# Patient Record
Sex: Female | Born: 1970 | Race: Black or African American | Hispanic: No | Smoking: Former smoker
Health system: Southern US, Community
[De-identification: ages and names within clinical notes are randomized; demographics above are authoritative.]

## PROBLEM LIST (undated history)

## (undated) DIAGNOSIS — M549 Dorsalgia, unspecified: Secondary | ICD-10-CM

## (undated) DIAGNOSIS — G43909 Migraine, unspecified, not intractable, without status migrainosus: Secondary | ICD-10-CM

## (undated) DIAGNOSIS — F329 Major depressive disorder, single episode, unspecified: Secondary | ICD-10-CM

## (undated) DIAGNOSIS — F419 Anxiety disorder, unspecified: Secondary | ICD-10-CM

## (undated) DIAGNOSIS — R42 Dizziness and giddiness: Secondary | ICD-10-CM

## (undated) DIAGNOSIS — F32A Depression, unspecified: Secondary | ICD-10-CM

## (undated) HISTORY — DX: Depression, unspecified: F32.A

## (undated) HISTORY — PX: TUBAL LIGATION: SHX77

## (undated) HISTORY — PX: ABDOMINAL HYSTERECTOMY: SHX81

## (undated) HISTORY — PX: ABLATION: SHX5711

## (undated) HISTORY — DX: Anxiety disorder, unspecified: F41.9

## (undated) HISTORY — DX: Major depressive disorder, single episode, unspecified: F32.9

---

## 2013-11-23 ENCOUNTER — Encounter (HOSPITAL_BASED_OUTPATIENT_CLINIC_OR_DEPARTMENT_OTHER): Payer: Self-pay | Admitting: Emergency Medicine

## 2013-11-23 ENCOUNTER — Emergency Department (HOSPITAL_BASED_OUTPATIENT_CLINIC_OR_DEPARTMENT_OTHER)
Admission: EM | Admit: 2013-11-23 | Discharge: 2013-11-23 | Disposition: A | Discharge status: Home / Self Care | Payer: No Typology Code available for payment source | Attending: Emergency Medicine | Admitting: Emergency Medicine

## 2013-11-23 DIAGNOSIS — M5412 Radiculopathy, cervical region: Secondary | ICD-10-CM | POA: Insufficient documentation

## 2013-11-23 DIAGNOSIS — Z720 Tobacco use: Secondary | ICD-10-CM | POA: Insufficient documentation

## 2013-11-23 MED ORDER — HYDROCODONE-ACETAMINOPHEN 5-325 MG PO TABS: 1.0000 | ORAL_TABLET | Freq: Four times a day (QID) | ORAL | Status: DC | PRN

## 2013-11-23 MED ORDER — HYDROCODONE-ACETAMINOPHEN 5-325 MG PO TABS
1.0000 | ORAL_TABLET | Freq: Once | ORAL | Status: AC
  Administered 2013-11-23: 1 via ORAL
  Filled 2013-11-23: qty 1

## 2013-11-23 NOTE — Discharge Instructions (Signed)

## 2013-11-23 NOTE — ED Provider Notes (Addendum)
CSN: [id number]     Arrival date & time 11/23/13  0034 History   First MD Initiated Contact with Patient 11/23/13 0054     Chief Complaint  Patient presents with  . 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 She is here with a four-day history of pain in her right neck radiating to her right shoulder and down her right upper arm. Are also associated paresthesias in the fingers of her right hand. Pain is worse with movement of the neck and less so with palpation of the neck and shoulder. Pain is also worse with movement of the right shoulder. She states her pain as a 7/10 at rest and worse with movement. She reports no functional defect. She denies specific injury. She has not taken anything for the pain stating that ibuprofen "doesn't do anything".  History reviewed. No pertinent past medical history. Past Surgical History  Procedure Laterality Date  . Tubal ligation    . Cesarean section    . Ablation     History reviewed. No pertinent family history. History  Substance Use Topics  . Smoking status: Current Every Day Smoker -- 0.50 packs/day for 27 years    Types: Cigarettes  . Smokeless tobacco: Never Used  . Alcohol Use: Not on file     Comment: occasions    OB History   Grav Para Term Preterm Abortions TAB SAB Ect Mult Living                 Review of Systems  All other systems reviewed and are negative.   Allergies  Augmentin; Keflex; Naproxen; and Zithromax  Home Medications   Prior to Admission medications   Not on File   BP 127/80  Pulse 84  Temp(Src) 98.5 F (36.9 C) (Oral)  Resp 16  Ht 5\' 6"  (1.676 m)  Wt 147 lb (66.679 kg)  BMI 23.74 kg/m2  SpO2 100%  LMP 11/13/2013  Physical Exam General: Well-developed, well-nourished female in no acute distress; appearance consistent with age of record HENT: normocephalic; atraumatic Eyes:  pupils equal, round and reactive to light; extraocular muscles intact Neck: supple; pain in right side of neck and shoulder on rotation to the left or right, flexion forward and less so on flexion backwards Heart: regular rate and rhythm Lungs: Normal respiratory effort and excursion Abdomen: soft; nondistended Extremities: No deformity; full range of motion but with pain on movement of the right shoulder Neurologic: Awake, alert and oriented; motor function intact in all extremities and symmetric; no facial droop Skin: Warm and dry Psychiatric: Normal mood and affect    ED Course  Procedures (including critical care time)  MDM     Wynetta Fines, MD 11/23/13 0103  Wynetta Fines, MD 11/23/13 0347

## 2013-11-23 NOTE — ED Notes (Signed)
Pt from home. Complaining of right shoulder pain x 1 week with increase in pain last two days. 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 Pt has not taken any OTC for the pain and has applied heat to the site with no decrease in pain. Pt rates pain 7/10 at this time.

## 2013-12-19 ENCOUNTER — Ambulatory Visit: Payer: No Typology Code available for payment source | Admitting: Family Medicine

## 2013-12-20 ENCOUNTER — Ambulatory Visit: Payer: No Typology Code available for payment source | Admitting: Family Medicine

## 2013-12-30 ENCOUNTER — Encounter: Payer: Self-pay | Admitting: Family Medicine

## 2013-12-30 ENCOUNTER — Ambulatory Visit (INDEPENDENT_AMBULATORY_CARE_PROVIDER_SITE_OTHER): Payer: No Typology Code available for payment source | Admitting: Family Medicine

## 2013-12-30 ENCOUNTER — Encounter (INDEPENDENT_AMBULATORY_CARE_PROVIDER_SITE_OTHER): Payer: Self-pay

## 2013-12-30 VITALS — BP 116/77 | HR 89 | Ht 66.0 in | Wt 143.0 lb

## 2013-12-30 DIAGNOSIS — M545 Low back pain, unspecified: Secondary | ICD-10-CM

## 2013-12-30 DIAGNOSIS — M542 Cervicalgia: Secondary | ICD-10-CM

## 2013-12-30 MED ORDER — PREDNISONE (PAK) 10 MG PO TABS: ORAL_TABLET | ORAL | Status: DC

## 2013-12-30 MED ORDER — OXYCODONE-ACETAMINOPHEN 5-325 MG PO TABS
1.0000 | ORAL_TABLET | Freq: Four times a day (QID) | ORAL | Status: DC | PRN
Start: 2013-12-30 — End: 2014-02-13

## 2013-12-30 MED ORDER — DICLOFENAC SODIUM 75 MG PO TBEC: 75.0000 mg | DELAYED_RELEASE_TABLET | Freq: Two times a day (BID) | ORAL | Status: DC

## 2013-12-30 MED ORDER — CYCLOBENZAPRINE HCL 10 MG PO TABS: 10.0000 mg | ORAL_TABLET | Freq: Three times a day (TID) | ORAL | Status: DC | PRN

## 2013-12-30 NOTE — Patient Instructions (Signed)
You have cervical radiculopathy (a pinched nerve in the neck). Prednisone 6 day dose pack to relieve irritation/inflammation of the nerve. Voltaren twice a day with food for pain and inflammation - start day AFTER finishing prednisone. Flexeril three times a day as needed for muscle spasms (can make you sleepy - if so do not drive while taking this). Percocet for severe pain (no driving on this medicine). Simple range of motion exercises within limits of pain to prevent further stiffness. Consider physical therapy for stretching, exercises, traction, and modalities. Heat 15 minutes at a time 3-4 times a day to help with spasms. Watch head position when on computers, texting, when sleeping in bed - should in line with back to prevent further nerve traction and irritation. If not improving we will consider an MRI.  You have low back pain likely from lumbar strain. Treatment is similar. Refer to handout regarding exercises and stretches to do for this. Do home exercises for shoulder as well 3 sets of 10 each direction. Follow up with me in 6 weeks for reevaluation.

## 2014-01-04 DIAGNOSIS — M542 Cervicalgia: Secondary | ICD-10-CM | POA: Insufficient documentation

## 2014-01-04 DIAGNOSIS — M545 Low back pain, unspecified: Secondary | ICD-10-CM | POA: Insufficient documentation

## 2014-01-04 DIAGNOSIS — M5412 Radiculopathy, cervical region: Secondary | ICD-10-CM | POA: Insufficient documentation

## 2014-01-04 NOTE — Assessment & Plan Note (Signed)
2/2 cervical radiculopathy.  Start with prednisone dose pack, then voltaren twice a day.  Flexeril with percocet as needed.  Previously did physical therapy - declined at this time.  Do home exercises.  Heat, ergonomic issues discussed.  If not improving will consider MRI.

## 2014-01-04 NOTE — Assessment & Plan Note (Signed)
consistent with lumbar strain.  Medications as noted above.  HEP reviewed to do daily.  F/u in 6 weeks for both issues.

## 2014-01-04 NOTE — Progress Notes (Signed)
PCP: No PCP Per Patient  Subjective:   HPI: Patient is a 43 y.o. female here for right shoulder, back pain.  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 Now has associated burning into her right arm and hand. Tried norco, icing, heat, aleve, flexeril. In 2013 did PT for this but also for her low back. Had an MRI in 2013 and was told she had a pinched nerve in her low back but then told she did not. Has had a cortisone injection in right shoulder without much benefit. No radiation into legs. No bowel/bladder dysfunction.  No past medical history on file.  No current outpatient prescriptions on file prior to visit.   No current facility-administered medications on file prior to visit.    Past Surgical History  Procedure Laterality Date  . Tubal ligation    . Cesarean section    . Ablation      Allergies  Allergen Reactions  . Augmentin [Amoxicillin-Pot Clavulanate] Hives  . Keflex [Cephalexin] Hives    Also respiratory depression  . Naproxen Hives  . Zithromax [Azithromycin] Hives    History   Social History  . Marital Status: Legally Separated    Spouse Name: N/A    Number of Children: N/A  . Years of Education: N/A   Occupational History  . Not on file.   Social History Main Topics  . Smoking status: Current Every Day Smoker -- 0.50 packs/day for 27 years    Types: Cigarettes  . Smokeless tobacco: Never Used  . Alcohol Use: Not on file     Comment: occasions   . Drug Use: No  . Sexual Activity: Yes    Birth Control/ Protection: None   Other Topics Concern  . Not on file   Social History Narrative    No family history on file.  BP 116/77 mmHg  Pulse 89  Ht 5\' 6"  (1.676 m)  Wt 143 lb (64.864 kg)  BMI 23.09 kg/m2  Review of Systems: See HPI above.    Objective:  Physical Exam:  Gen: NAD  Neck: No gross deformity, swelling, bruising. TTP right  cervical paraspinal region.  No midline/bony TTP. FROM neck - pain lateral rotations, extension. BUE strength 5/5.   Sensation intact to light touch.   1+ equal reflexes in triceps, biceps, brachioradialis tendons. Negative spurlings. NV intact distal BUEs.  R shoulder: No swelling, ecchymoses.  No gross deformity. No TTP. FROM. Negative Hawkins, Neers. Negative Speeds, [employer]. Strength 5/5 with empty can and resisted internal/external rotation.  Pain empty can mildly. Negative apprehension. NV intact distally.  Back: No gross deformity, scoliosis. TTP bilateral paraspinal regions.  No midline or bony TTP. FROM with pain on flexion. Strength LEs 5/5 all muscle groups.  2+ MSRs in patellar and achilles tendons, equal bilaterally. Negative SLRs. Sensation intact to light touch bilaterally. Negative logroll bilateral hips Negative fabers and piriformis stretches.    Assessment & Plan:  1. Neck pain - 2/2 cervical radiculopathy.  Start with prednisone dose pack, then voltaren twice a day.  Flexeril with percocet as needed.  Previously did physical therapy - declined at this time.  Do home exercises.  Heat, ergonomic issues discussed.  If not improving will consider MRI.  2. Low back pain - consistent with lumbar strain.  Medications as noted above.  HEP reviewed to do daily.  F/u in 6 weeks for  both issues.

## 2014-02-08 ENCOUNTER — Encounter (HOSPITAL_BASED_OUTPATIENT_CLINIC_OR_DEPARTMENT_OTHER): Payer: Self-pay | Admitting: Emergency Medicine

## 2014-02-08 ENCOUNTER — Emergency Department (HOSPITAL_BASED_OUTPATIENT_CLINIC_OR_DEPARTMENT_OTHER)
Admission: EM | Admit: 2014-02-08 | Discharge: 2014-02-08 | Disposition: A | Discharge status: Home / Self Care | Payer: No Typology Code available for payment source | Attending: Emergency Medicine | Admitting: Emergency Medicine

## 2014-02-08 DIAGNOSIS — Z720 Tobacco use: Secondary | ICD-10-CM | POA: Insufficient documentation

## 2014-02-08 DIAGNOSIS — M549 Dorsalgia, unspecified: Secondary | ICD-10-CM | POA: Diagnosis present

## 2014-02-08 DIAGNOSIS — S29019A Strain of muscle and tendon of unspecified wall of thorax, initial encounter: Secondary | ICD-10-CM

## 2014-02-08 DIAGNOSIS — Z7952 Long term (current) use of systemic steroids: Secondary | ICD-10-CM | POA: Diagnosis not present

## 2014-02-08 DIAGNOSIS — W240XXA Contact with lifting devices, not elsewhere classified, initial encounter: Secondary | ICD-10-CM | POA: Insufficient documentation

## 2014-02-08 DIAGNOSIS — S29012A Strain of muscle and tendon of back wall of thorax, initial encounter: Secondary | ICD-10-CM | POA: Insufficient documentation

## 2014-02-08 DIAGNOSIS — Z880 Allergy status to penicillin: Secondary | ICD-10-CM | POA: Insufficient documentation

## 2014-02-08 DIAGNOSIS — Z79899 Other long term (current) drug therapy: Secondary | ICD-10-CM | POA: Insufficient documentation

## 2014-02-08 DIAGNOSIS — S39012A Strain of muscle, fascia and tendon of lower back, initial encounter: Secondary | ICD-10-CM | POA: Diagnosis not present

## 2014-02-08 DIAGNOSIS — Y93F2 Activity, caregiving, lifting: Secondary | ICD-10-CM | POA: Diagnosis not present

## 2014-02-08 DIAGNOSIS — Y998 Other external cause status: Secondary | ICD-10-CM | POA: Diagnosis not present

## 2014-02-08 DIAGNOSIS — Y9289 Other specified places as the place of occurrence of the external cause: Secondary | ICD-10-CM | POA: Diagnosis not present

## 2014-02-08 MED ORDER — HYDROCODONE-ACETAMINOPHEN 5-325 MG PO TABS: 1.0000 | ORAL_TABLET | Freq: Four times a day (QID) | ORAL | Status: DC | PRN

## 2014-02-08 MED ORDER — HYDROMORPHONE HCL 1 MG/ML IJ SOLN
1.0000 mg | Freq: Once | INTRAMUSCULAR | Status: AC
  Administered 2014-02-08: 1 mg via INTRAMUSCULAR
  Filled 2014-02-08: qty 1

## 2014-02-08 NOTE — ED Provider Notes (Signed)
CSN: [id number]     Arrival date & time 02/08/14  0003 History   First MD Initiated Contact with Patient 02/08/14 0017     Chief Complaint  Patient presents with  . Back Pain     (Consider location/radiation/quality/duration/timing/severity/associated sxs/prior Treatment) Patient is a 43 y.o. female presenting with back pain. The history is provided by the patient.  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 Relieved by:  Nothing Worsened by:  Bending, coughing, movement and twisting Ineffective treatments:  Heating pad, muscle relaxants, narcotics and NSAIDs Associated symptoms: leg pain   Associated symptoms: no abdominal pain, no bladder incontinence, no dysuria, no fever, no headaches, no numbness, no paresthesias, no perianal numbness, no tingling and no weakness   Risk factors comment:  History of back and neck pain   History reviewed. No pertinent past medical history. Past Surgical History  Procedure Laterality Date  . Tubal ligation    . Cesarean section    . Ablation     No family history on file. History  Substance Use Topics  . Smoking status: Current Every Day Smoker -- 0.50 packs/day for 27 years    Types: Cigarettes  . Smokeless tobacco: Never Used  . Alcohol Use: 0.6 oz/week    1 Glasses of wine per week     Comment: occasions    OB History    No data available     Review of Systems  Constitutional: Negative for fever.  Gastrointestinal: Negative for abdominal pain.  Genitourinary: Negative for bladder incontinence and dysuria.  Musculoskeletal: Positive for back pain.    Neurological: Negative for tingling, weakness, numbness, headaches and paresthesias.  All other systems reviewed and are negative.     Allergies  Augmentin; Keflex; Naproxen; and Zithromax  Home Medications   Prior to Admission medications   Medication Sig Start Date End Date Taking? Authorizing Provider  meclizine (ANTIVERT) 25 MG tablet Take 25 mg by mouth 3 (three) times daily as needed for dizziness.   Yes Historical Provider, MD  Temazepam (RESTORIL PO) Take by mouth.   Yes Historical Provider, MD  cyclobenzaprine (FLEXERIL) 10 MG tablet Take 1 tablet (10 mg total) by mouth every 8 (eight) hours as needed. 12/30/13   Dene Gentry, MD  diclofenac (VOLTAREN) 75 MG EC tablet Take 1 tablet (75 mg total) by mouth 2 (two) times daily. Start day AFTER finishing prednisone. 12/30/13   Dene Gentry, MD  HYDROcodone-acetaminophen (NORCO/VICODIN) 5-325 MG per tablet Take 1 tablet by mouth every 6 (six) hours as needed for moderate pain or severe pain. 02/08/14   Blanchie Dessert, MD  oxyCODONE-acetaminophen (PERCOCET/ROXICET) 5-325 MG per tablet Take 1 tablet by mouth every 6 (six) hours as needed for severe pain. 12/30/13   Dene Gentry, MD  predniSONE (STERAPRED UNI-PAK) 10 MG tablet 6 tabs po day 1, 5 tabs po day 2, 4 tabs po day 3, 3 tabs po day 4, 2 tabs po day 5, 1 tab po day 6 12/30/13   Dene Gentry, MD   BP 120/69 mmHg  Pulse 89  Temp(Src) 99.3 F (37.4 C) (Oral)  Resp 18  Ht 5\' 7"  (1.702 m)  Wt 152 lb (68.947 kg)  BMI 23.80 kg/m2  SpO2 100%  LMP 01/11/2014 (Approximate) Physical Exam  Constitutional: She is oriented to person, place, and time. She appears well-developed and well-nourished. No distress.  HENT:  Head: Normocephalic and atraumatic.  Eyes: EOM are normal. Pupils are equal, round, and reactive to light.  Cardiovascular: Normal rate, regular rhythm, normal heart sounds and intact distal pulses.  Exam reveals no friction rub.   No murmur  heard. Pulmonary/Chest: Effort normal and breath sounds normal. She has no wheezes. She has no rales.  Abdominal: Soft. Bowel sounds are normal. She exhibits no distension. There is no tenderness. There is no rebound and no guarding.  Musculoskeletal: Normal range of motion. She exhibits tenderness.       Thoracic back: She exhibits tenderness, pain and spasm. She exhibits normal range of motion and no bony tenderness.       Lumbar back: She exhibits tenderness, pain and spasm. She exhibits no bony tenderness.       Back:  No edema  Neurological: She is alert and oriented to person, place, and time. She has normal strength. No cranial nerve deficit or sensory deficit. Gait normal.  Skin: Skin is warm and dry. No rash noted.  Psychiatric: She has a normal mood and affect. Her behavior is normal.  Nursing note and vitals reviewed.   ED Course  Procedures (including critical care time) Labs Review Labs Reviewed - No data to display  Imaging Review No results found.   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  She has been taking percocet/flexeril and diclofenac however states she does not like the way oxy makes her feel and diclofenac makes her itch.  She is NV intact here and able to ambulate without difficulty.  No IVDU or bowel/bladder sx concerning for cord compromise.  Pt sees Dr. Barbaraann Barthel for this issue.  Pt given vicodin to use instead of oxy and given several days off work.  She has f/u appt with Dr. Barbaraann Barthel in [place].    Blanchie Dessert, MD 02/08/14 [phone]

## 2014-02-08 NOTE — ED Notes (Signed)
Pain in low back, right shoulder and arm.  Pt sts this is a recurrence of a chronic problem.

## 2014-02-13 ENCOUNTER — Encounter: Payer: Self-pay | Admitting: Family Medicine

## 2014-02-13 ENCOUNTER — Ambulatory Visit (HOSPITAL_BASED_OUTPATIENT_CLINIC_OR_DEPARTMENT_OTHER)
Admission: RE | Admit: 2014-02-13 | Discharge: 2014-02-13 | Disposition: A | Discharge status: Home / Self Care | Payer: No Typology Code available for payment source | Source: Ambulatory Visit | Attending: Family Medicine | Admitting: Family Medicine

## 2014-02-13 ENCOUNTER — Ambulatory Visit (INDEPENDENT_AMBULATORY_CARE_PROVIDER_SITE_OTHER): Payer: No Typology Code available for payment source | Admitting: Family Medicine

## 2014-02-13 VITALS — BP 124/80 | HR 80 | Ht 67.0 in | Wt 153.0 lb

## 2014-02-13 DIAGNOSIS — M5032 Other cervical disc degeneration, mid-cervical region: Secondary | ICD-10-CM | POA: Diagnosis not present

## 2014-02-13 DIAGNOSIS — M542 Cervicalgia: Secondary | ICD-10-CM | POA: Diagnosis not present

## 2014-02-13 DIAGNOSIS — R200 Anesthesia of skin: Secondary | ICD-10-CM | POA: Diagnosis not present

## 2014-02-13 DIAGNOSIS — M545 Low back pain: Secondary | ICD-10-CM | POA: Insufficient documentation

## 2014-02-13 DIAGNOSIS — M5442 Lumbago with sciatica, left side: Secondary | ICD-10-CM | POA: Diagnosis not present

## 2014-02-13 DIAGNOSIS — M5441 Lumbago with sciatica, right side: Secondary | ICD-10-CM

## 2014-02-13 MED ORDER — HYDROCODONE-ACETAMINOPHEN 5-325 MG PO TABS
1.0000 | ORAL_TABLET | Freq: Four times a day (QID) | ORAL | Status: DC | PRN
Start: 2014-02-13 — End: 2014-07-12

## 2014-02-13 NOTE — Patient Instructions (Signed)
Get x-rays as you leave today - we will contact you with the results. Take flexeril, hydrocodone as needed for severe pain. The notes have to be faxed in to get approval for the MRIs of your neck and back.

## 2014-02-14 NOTE — Assessment & Plan Note (Signed)
has also not improved with home exercises, prednisone, voltaren, flexeril, percocet.  Will go ahead with MRI to assess for disc pathology/herniation.

## 2014-02-14 NOTE — Assessment & Plan Note (Signed)
2/2 cervical radiculopathy.  Has not improved since 12/30/13 with home exercises, prednisone, voltaren, flexeril, percocet.  Will go ahead with MRI to assess for disc herniation.

## 2014-02-14 NOTE — Progress Notes (Signed)
PCP: PALMER, CHARLES  Subjective:   HPI: Patient is a 44 y.o. female here for right shoulder, back pain.  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 Now has associated burning into her right arm and hand. Tried norco, icing, heat, aleve, flexeril. In 2013 did PT for this but also for her low back. Had an MRI in 2013 and was told she had a pinched nerve in her low back but then told she did not. Has had a cortisone injection in right shoulder without much benefit. No radiation into legs. No bowel/bladder dysfunction.  1/4: Patient reports she's not had much change in neck or back pain since last visit. Both about 7/10 level of pain. Finished prednisone. Gets burning sensation into both arms and legs. Doing home exercises and stretches since last visit. Went to ED on 12/31 due to pain. Tried prednisone, voltaren, flexeril, percocet, norco since last visit. No bowel/bladder dysfunction.  No past medical history on file.  Current Outpatient Prescriptions on File Prior to Visit  Medication Sig Dispense Refill  . cyclobenzaprine (FLEXERIL) 10 MG tablet Take 1 tablet (10 mg total) by mouth every 8 (eight) hours as needed. 60 tablet 1  . diclofenac (VOLTAREN) 75 MG EC tablet Take 1 tablet (75 mg total) by mouth 2 (two) times daily. Start day AFTER finishing prednisone. 60 tablet 1  . meclizine (ANTIVERT) 25 MG tablet Take 25 mg by mouth 3 (three) times daily as needed for dizziness.    . Temazepam (RESTORIL PO) Take by mouth.     No current facility-administered medications on file prior to visit.    Past Surgical History  Procedure Laterality Date  . Tubal ligation    . Cesarean section    . Ablation      Allergies  Allergen Reactions  . Augmentin [Amoxicillin-Pot Clavulanate] Hives  . Keflex [Cephalexin] Hives    Also respiratory depression  . Naproxen Hives  . Zithromax  [Azithromycin] Hives    History   Social History  . Marital Status: Legally Separated    Spouse Name: N/A    Number of Children: N/A  . Years of Education: N/A   Occupational History  . Not on file.   Social History Main Topics  . Smoking status: Current Every Day Smoker -- 0.50 packs/day for 27 years    Types: Cigarettes  . Smokeless tobacco: Never Used  . Alcohol Use: 0.6 oz/week    1 Glasses of wine per week     Comment: occasions   . Drug Use: No  . Sexual Activity: Yes    Birth Control/ Protection: Surgical   Other Topics Concern  . Not on file   Social History Narrative    No family history on file.  BP 124/80 mmHg  Pulse 80  Ht 5\' 7"  (1.702 m)  Wt 153 lb (69.4 kg)  BMI 23.96 kg/m2  LMP 02/13/2014  Review of Systems: See HPI above.    Objective:  Physical Exam:  Gen: NAD  Neck: No gross deformity, swelling, bruising. TTP right cervical paraspinal region.  No midline/bony TTP. FROM neck - pain lateral rotations, extension. BUE strength 5/5.   Sensation intact to light touch.   1+ equal reflexes in triceps, biceps, brachioradialis tendons. Negative spurlings. NV intact distal BUEs.  R shoulder: No swelling, ecchymoses.  No gross deformity. No TTP. FROM. Negative Hawkins, Neers. Negative  Speeds, [employer]. Strength 5/5 with empty can and resisted internal/external rotation.  Negative apprehension. NV intact distally.  Back: No gross deformity, scoliosis. TTP bilateral paraspinal regions.  No midline or bony TTP. FROM with pain on flexion > extension. Strength LEs 5/5 all muscle groups.  2+ MSRs in patellar and achilles tendons, equal bilaterally. Negative SLRs. Sensation intact to light touch bilaterally. Negative logroll bilateral hips Negative fabers and piriformis stretches.    Assessment & Plan:  1. Neck pain - 2/2 cervical radiculopathy.  Has not improved since 12/30/13 with home exercises, prednisone, voltaren, flexeril,  percocet.  Will go ahead with MRI to assess for disc herniation.  2. Low back pain - has also not improved with home exercises, prednisone, voltaren, flexeril, percocet.  Will go ahead with MRI to assess for disc pathology/herniation.

## 2014-02-15 NOTE — Addendum Note (Signed)
Addended by: Sherrie George F on: 02/15/2014 09:00 AM   Modules accepted: Orders

## 2014-04-01 ENCOUNTER — Ambulatory Visit (HOSPITAL_BASED_OUTPATIENT_CLINIC_OR_DEPARTMENT_OTHER): Payer: No Typology Code available for payment source

## 2014-05-25 ENCOUNTER — Encounter (HOSPITAL_BASED_OUTPATIENT_CLINIC_OR_DEPARTMENT_OTHER): Payer: Self-pay | Admitting: Emergency Medicine

## 2014-05-25 ENCOUNTER — Emergency Department (HOSPITAL_BASED_OUTPATIENT_CLINIC_OR_DEPARTMENT_OTHER)
Admission: EM | Admit: 2014-05-25 | Discharge: 2014-05-26 | Disposition: A | Discharge status: Home / Self Care | Payer: No Typology Code available for payment source | Attending: Emergency Medicine | Admitting: Emergency Medicine

## 2014-05-25 DIAGNOSIS — M7088 Other soft tissue disorders related to use, overuse and pressure other site: Secondary | ICD-10-CM | POA: Insufficient documentation

## 2014-05-25 DIAGNOSIS — Z791 Long term (current) use of non-steroidal anti-inflammatories (NSAID): Secondary | ICD-10-CM | POA: Diagnosis not present

## 2014-05-25 DIAGNOSIS — Z720 Tobacco use: Secondary | ICD-10-CM | POA: Diagnosis not present

## 2014-05-25 DIAGNOSIS — S6991XA Unspecified injury of right wrist, hand and finger(s), initial encounter: Secondary | ICD-10-CM | POA: Diagnosis present

## 2014-05-25 DIAGNOSIS — Y9389 Activity, other specified: Secondary | ICD-10-CM | POA: Diagnosis not present

## 2014-05-25 DIAGNOSIS — Y998 Other external cause status: Secondary | ICD-10-CM | POA: Insufficient documentation

## 2014-05-25 DIAGNOSIS — X58XXXA Exposure to other specified factors, initial encounter: Secondary | ICD-10-CM | POA: Insufficient documentation

## 2014-05-25 DIAGNOSIS — Y9289 Other specified places as the place of occurrence of the external cause: Secondary | ICD-10-CM | POA: Diagnosis not present

## 2014-05-25 DIAGNOSIS — X503XXA Overexertion from repetitive movements, initial encounter: Secondary | ICD-10-CM

## 2014-05-25 DIAGNOSIS — T148XXA Other injury of unspecified body region, initial encounter: Secondary | ICD-10-CM

## 2014-05-25 HISTORY — DX: Dorsalgia, unspecified: M54.9

## 2014-05-25 HISTORY — DX: Dizziness and giddiness: R42

## 2014-05-25 NOTE — ED Notes (Signed)
Pt woke up this morning with right wrist pain.  No known injury.  No hx of same.

## 2014-05-25 NOTE — ED Notes (Addendum)
Woke this am w rt wrist pain  Denies known inj,  Slight swelling noted

## 2014-05-26 ENCOUNTER — Encounter (HOSPITAL_BASED_OUTPATIENT_CLINIC_OR_DEPARTMENT_OTHER): Payer: Self-pay | Admitting: Emergency Medicine

## 2014-05-26 ENCOUNTER — Emergency Department (HOSPITAL_BASED_OUTPATIENT_CLINIC_OR_DEPARTMENT_OTHER): Payer: No Typology Code available for payment source

## 2014-05-26 MED ORDER — PREDNISONE 50 MG PO TABS
60.0000 mg | ORAL_TABLET | Freq: Once | ORAL | Status: DC
  Filled 2014-05-26 (×2): qty 1

## 2014-05-26 MED ORDER — HYDROCODONE-ACETAMINOPHEN 5-325 MG PO TABS: 1.0000 | ORAL_TABLET | Freq: Four times a day (QID) | ORAL | Status: DC | PRN

## 2014-05-26 MED ORDER — TRAMADOL HCL 50 MG PO TABS
50.0000 mg | ORAL_TABLET | Freq: Once | ORAL | Status: DC
  Filled 2014-05-26: qty 1

## 2014-05-26 MED ORDER — IBUPROFEN 800 MG PO TABS: 800.0000 mg | ORAL_TABLET | Freq: Once | ORAL | Status: DC

## 2014-05-26 NOTE — ED Provider Notes (Signed)
CSN: [id number]     Arrival date & time 05/25/14  2339 History   First MD Initiated Contact with Patient 05/26/14 0015     Chief Complaint  Patient presents with  . Wrist Pain     (Consider location/radiation/quality/duration/timing/severity/associated sxs/prior Treatment) Patient is a 44 y.o. female presenting with wrist pain. The history is provided by the patient.  Wrist Pain This is a new problem. The current episode started yesterday. The problem occurs constantly. The problem has not changed since onset.Pertinent negatives include no abdominal pain. Nothing aggravates the symptoms. Nothing relieves the symptoms. She has tried a cold compress for the symptoms. The treatment provided no relief.  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    Past Medical History  Diagnosis Date  . Back pain   . Vertigo    Past Surgical History  Procedure Laterality Date  . Tubal ligation    . Cesarean section    . Ablation     History reviewed. No pertinent family history. History  Substance Use Topics  . Smoking status: Current Every Day Smoker -- 0.50 packs/day for 27 years    Types: Cigarettes  . Smokeless tobacco: Never Used  . Alcohol Use: 0.6 oz/week    1 Glasses of wine per week     Comment: occasions    OB History    No data available     Review of Systems  Gastrointestinal: Negative for abdominal pain.  All other systems reviewed and are negative.     Allergies  Augmentin; Keflex; Naproxen; and Zithromax  Home Medications   Prior to Admission medications   Medication Sig Start Date End Date Taking? Authorizing Provider  cyclobenzaprine (FLEXERIL) 10 MG tablet Take 1 tablet (10 mg total) by mouth every 8 (eight) hours as needed. 12/30/13   Dene Gentry, MD  diclofenac (VOLTAREN) 75 MG EC tablet Take 1 tablet (75 mg total) by mouth 2 (two) times daily. Start day AFTER finishing prednisone. 12/30/13   Dene Gentry, MD   HYDROcodone-acetaminophen (NORCO/VICODIN) 5-325 MG per tablet Take 1 tablet by mouth every 6 (six) hours as needed for moderate pain or severe pain. 02/13/14   Dene Gentry, MD  HYDROcodone-acetaminophen (NORCO/VICODIN) 5-325 MG per tablet Take 1 tablet by mouth every 6 (six) hours as needed for severe pain. 05/26/14   Evagelia Knack, MD  meclizine (ANTIVERT) 25 MG tablet Take 25 mg by mouth 3 (three) times daily as needed for dizziness.    Historical Provider, MD  Temazepam (RESTORIL PO) Take by mouth.    Historical Provider, MD   BP 123/69 mmHg  Pulse 93  Temp(Src) 98.5 F (36.9 C) (Oral)  Resp 16  Ht 5\' 7"  (1.702 m)  Wt 149 lb 9.3 oz (67.849 kg)  BMI 23.42 kg/m2  SpO2 100%  LMP 05/17/2014 (Approximate) Physical Exam  Constitutional: She is oriented to person, place, and time. She appears well-developed and well-nourished. No distress.  HENT:  Head: Normocephalic and atraumatic.  Mouth/Throat: Oropharynx is clear and moist.  Eyes: Conjunctivae and EOM are normal.  Neck: Normal range of motion. Neck supple.  Cardiovascular: Normal rate and regular rhythm.   Pulmonary/Chest: Effort normal and breath sounds normal. No respiratory distress. She has no wheezes. She has no rales.  Abdominal: Soft. Bowel sounds are normal. There is no tenderness.  Musculoskeletal: Normal range of motion.       Right wrist: She exhibits normal range of motion, no  tenderness, no bony tenderness, no swelling, no effusion, no crepitus, no deformity and no laceration.       Right forearm: She exhibits no tenderness, no bony tenderness, no swelling, no edema, no deformity and no laceration.  Right hand NVI  Neurological: She is alert and oriented to person, place, and time.  Skin: Skin is warm and dry.  Psychiatric: She has a normal mood and affect.    ED Course  Procedures (including critical care time) Labs Review Labs Reviewed - No data to display  Imaging Review Dg Wrist Complete Right  05/26/2014    CLINICAL DATA:  Right wrist pain, onset last evening, pain throughout the day.  EXAM: RIGHT WRIST - COMPLETE 3+ VIEW  COMPARISON:  None.  FINDINGS: No fracture or dislocation. The alignment and joint spaces are maintained. Bone mineralization is normal. No erosion or periosteal reaction. Scaphoid is normal. No soft tissue abnormality.  IMPRESSION: No acute bony abnormality.   Electronically Signed   By: Jeb Levering M.D.   On: 05/26/2014 01:10     EKG Interpretation None      MDM   Final diagnoses:  Repetitive use syndrome  Refused pain medication in the ED.    Repetitive use injury.  Will place in wrist splint for comfort, ice elevation and close follow up    Destyn Schuyler, MD 05/26/14 [phone]

## 2014-07-12 ENCOUNTER — Ambulatory Visit (INDEPENDENT_AMBULATORY_CARE_PROVIDER_SITE_OTHER): Payer: No Typology Code available for payment source | Admitting: Family Medicine

## 2014-07-12 ENCOUNTER — Encounter: Payer: Self-pay | Admitting: Family Medicine

## 2014-07-12 VITALS — BP 120/75 | HR 83 | Ht 66.0 in | Wt 143.0 lb

## 2014-07-12 DIAGNOSIS — M542 Cervicalgia: Secondary | ICD-10-CM | POA: Diagnosis not present

## 2014-07-12 MED ORDER — DICLOFENAC SODIUM 75 MG PO TBEC: 75.0000 mg | DELAYED_RELEASE_TABLET | Freq: Two times a day (BID) | ORAL | Status: DC

## 2014-07-12 MED ORDER — CYCLOBENZAPRINE HCL 10 MG PO TABS
10.0000 mg | ORAL_TABLET | Freq: Three times a day (TID) | ORAL | Status: DC | PRN
Start: 2014-07-12 — End: 2015-02-05

## 2014-07-12 NOTE — Patient Instructions (Signed)
We will go ahead with approval for the MRI of your neck. Physical therapy is the other option for this though you've not improved with extensive conservative treatment over the past several months.

## 2014-07-14 NOTE — Assessment & Plan Note (Signed)
2/2 cervical radiculopathy.  Struggling with this for several months despite home exercises, prednisone, voltaren, flexeril, percocet.  Will go ahead with MRI to assess for disc herniation.

## 2014-07-14 NOTE — Progress Notes (Addendum)
PCP: PALMER, CHARLES  Subjective:   HPI: Patient is a 44 y.o. female here for right shoulder, neck pain.  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 Now has associated burning into her right arm and hand. Tried norco, icing, heat, aleve, flexeril. In 2013 did PT for this but also for her low back. Had an MRI in 2013 and was told she had a pinched nerve in her low back but then told she did not. Has had a cortisone injection in right shoulder without much benefit. No radiation into legs. No bowel/bladder dysfunction.  1/4: Patient reports she's not had much change in neck or back pain since last visit. Both about 7/10 level of pain. Finished prednisone. Gets burning sensation into both arms and legs. Doing home exercises and stretches since last visit. Went to ED on 12/31 due to pain. Tried prednisone, voltaren, flexeril, percocet, norco since last visit. No bowel/bladder dysfunction.  6/1: Patient never went ahead with MRI of her cervical spine due to cost though interested in pursuing this now. Still with pain into right side of neck into shoulder. Associated burning. Previously tried prednisone, voltaren, flexeril, pain medication. Radiation goes to fingertips with numbness on right side. No bowel/bladder dysfunction.  Past Medical History  Diagnosis Date  . Back pain   . Vertigo     Current Outpatient Prescriptions on File Prior to Visit  Medication Sig Dispense Refill  . meclizine (ANTIVERT) 25 MG tablet Take 25 mg by mouth 3 (three) times daily as needed for dizziness.     No current facility-administered medications on file prior to visit.    Past Surgical History  Procedure Laterality Date  . Tubal ligation    . Cesarean section    . Ablation      Allergies  Allergen Reactions  . Augmentin [Amoxicillin-Pot Clavulanate] Hives  . Keflex [Cephalexin]  Hives    Also respiratory depression  . Naproxen Hives  . Zithromax [Azithromycin] Hives    History   Social History  . Marital Status: Legally Separated    Spouse Name: N/A  . Number of Children: N/A  . Years of Education: N/A   Occupational History  . Not on file.   Social History Main Topics  . Smoking status: Current Every Day Smoker -- 0.50 packs/day for 27 years    Types: Cigarettes  . Smokeless tobacco: Never Used  . Alcohol Use: 0.6 oz/week    1 Glasses of wine per week     Comment: occasions   . Drug Use: No  . Sexual Activity: Yes    Birth Control/ Protection: Surgical   Other Topics Concern  . Not on file   Social History Narrative    No family history on file.  BP 120/75 mmHg  Pulse 83  Ht 5\' 6"  (1.676 m)  Wt 143 lb (64.864 kg)  BMI 23.09 kg/m2  Review of Systems: See HPI above.    Objective:  Physical Exam:  Gen: NAD  Neck: No gross deformity, swelling, bruising. TTP right cervical paraspinal region.  No midline/bony TTP. FROM neck - pain lateral rotations, extension. BUE strength 5/5.   Sensation intact to light touch.   1+ equal reflexes in biceps, brachioradialis tendons.  Trace right triceps, 1+ left triceps. Negative spurlings. NV intact distal BUEs.    Assessment & Plan:  1. Neck pain - 2/2 cervical radiculopathy.  Struggling with this for several months despite home exercises, prednisone, voltaren, flexeril, percocet.  Will go ahead with MRI to assess for disc herniation.  Addendum:  MRI reviewed and discussed with patient.  She has prominent spondylosis at C5-6 on the right, possible affect on the C6 nerve root.  Discussed PT, ESI - she would like to try ESI first given her level of pain and call us in 1-2 weeks.

## 2014-07-21 ENCOUNTER — Emergency Department (HOSPITAL_BASED_OUTPATIENT_CLINIC_OR_DEPARTMENT_OTHER)
Admission: EM | Admit: 2014-07-21 | Discharge: 2014-07-22 | Disposition: A | Discharge status: Home / Self Care | Payer: No Typology Code available for payment source | Attending: Emergency Medicine | Admitting: Emergency Medicine

## 2014-07-21 ENCOUNTER — Encounter (HOSPITAL_BASED_OUTPATIENT_CLINIC_OR_DEPARTMENT_OTHER): Payer: Self-pay

## 2014-07-21 DIAGNOSIS — R202 Paresthesia of skin: Secondary | ICD-10-CM | POA: Insufficient documentation

## 2014-07-21 DIAGNOSIS — M545 Low back pain, unspecified: Secondary | ICD-10-CM

## 2014-07-21 DIAGNOSIS — M549 Dorsalgia, unspecified: Secondary | ICD-10-CM | POA: Diagnosis present

## 2014-07-21 DIAGNOSIS — Z791 Long term (current) use of non-steroidal anti-inflammatories (NSAID): Secondary | ICD-10-CM | POA: Insufficient documentation

## 2014-07-21 DIAGNOSIS — Z720 Tobacco use: Secondary | ICD-10-CM | POA: Diagnosis not present

## 2014-07-21 DIAGNOSIS — G8929 Other chronic pain: Secondary | ICD-10-CM | POA: Insufficient documentation

## 2014-07-21 MED ORDER — OXYCODONE-ACETAMINOPHEN 5-325 MG PO TABS: 1.0000 | ORAL_TABLET | ORAL | Status: DC | PRN

## 2014-07-21 MED ORDER — HYDROMORPHONE HCL 1 MG/ML IJ SOLN
2.0000 mg | Freq: Once | INTRAMUSCULAR | Status: AC
Start: 2014-07-22 — End: 2014-07-22
  Administered 2014-07-22: 2 mg via INTRAMUSCULAR
  Filled 2014-07-21: qty 2

## 2014-07-21 NOTE — ED Provider Notes (Addendum)
CSN: [id number]     Arrival date & time 07/21/14  2054 History  This chart was scribed for Shanon Rosser, MD by Delphia Grates, ED Scribe. This patient was seen in room MH11/MH11 and the patient's care was started at 11:45 PM.    Chief Complaint  Patient presents with  . Back Pain     The history is provided by the patient. No language interpreter was used.     HPI Comments: Shelby Moreno is a 44 y.o. female, with history of chronic back pain, who presents to the Emergency Department complaining of burning, 7/10, bilateral lower back pain with radiation to the left neck that began approximately 5 hours ago. 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 There is associated tingling in the right lower leg and patient notes that the pain is worse with movement. There is no longer any radiation to the left neck. Patient reports her back pain is being managed by Dr. Barbaraann Barthel and she is scheduled to have an MRI tomorrow. Patient has not contacted Dr. Barbaraann Barthel regarding this exacerbation of her chronic back pain. She has taken Aleve without relief. She is requesting complete elimination of her pain and wants to know exactly what is causing it.   Past Medical History  Diagnosis Date  . Back pain   . Vertigo    Past Surgical History  Procedure Laterality Date  . Tubal ligation    . Cesarean section    . Ablation     No family history on file. History  Substance Use Topics  . Smoking status: Current Every Day Smoker -- 0.50 packs/day for 27 years    Types: Cigarettes  . Smokeless tobacco: Never Used  . Alcohol Use: 0.6 oz/week    1 Glasses of wine per week     Comment: occasions    OB History    No data available     Review of Systems  A complete 10 system review of systems was obtained and all systems are negative except as noted in the HPI and PMH.    Allergies  Augmentin; Keflex; Naproxen; and Zithromax  Home Medications    Prior to Admission medications   Medication Sig Start Date End Date Taking? Authorizing Provider  cyclobenzaprine (FLEXERIL) 10 MG tablet Take 1 tablet (10 mg total) by mouth every 8 (eight) hours as needed. 07/12/14   Dene Gentry, MD  diclofenac (VOLTAREN) 75 MG EC tablet Take 1 tablet (75 mg total) by mouth 2 (two) times daily. Start day AFTER finishing prednisone. 07/12/14   Dene Gentry, MD  meclizine (ANTIVERT) 25 MG tablet Take 25 mg by mouth 3 (three) times daily as needed for dizziness.    Historical Provider, MD  oxyCODONE-acetaminophen (PERCOCET) 5-325 MG per tablet Take 1 tablet by mouth every 4 (four) hours as needed (for pain). 07/21/14   Eboney Claybrook, MD  temazepam (RESTORIL) 22.5 MG capsule  06/04/14   Historical Provider, MD   Triage Vitals: BP 107/68 mmHg  Pulse 93  Temp(Src) 98.6 F (37 C) (Oral)  Resp 18  Ht 5\' 6"  (1.676 m)  Wt 143 lb (64.864 kg)  BMI 23.09 kg/m2  SpO2 100%  LMP 07/07/2014  Physical Exam  Nursing note and vitals reviewed. General: Well-developed, well-nourished female in no acute distress; appearance consistent with age of record HENT: normocephalic; atraumatic Eyes: pupils equal, round and reactive to light; extraocular muscles intact Neck: supple Heart: regular rate  and rhythm Lungs: clear to auscultation bilaterally Abdomen: soft; nondistended; nontender; no masses or hepatosplenomegaly; bowel sounds present BACK: bilateral paralumbar tenderness; positive straight leg raise on the left; negative straight leg raise on the right. Extremities: No deformity; full range of motion; pulses normal Neurologic: Awake, alert and oriented; motor function intact in all extremities and symmetric; no facial droop; sensation intact on the lower extremities, but subjectively decreased on the left lower leg Skin: Warm and dry Psychiatric: Normal mood and affect    ED Course  Procedures (including critical care time)  DIAGNOSTIC STUDIES: Oxygen  Saturation is 100% on room air, normal by my interpretation.    COORDINATION OF CARE: At 2350 Discussed treatment plan with patient which includes pain medication. Patient agrees.     MDM  The patient was advised that complete elimination of her pain is not feasible but that we will give her a shot of Dilaudid and provide Percocet to get her through until Monday when she can contact Dr. Barbaraann Barthel. She was advised that determining the cause of her pain will best be accomplished by the MRI she has scheduled tomorrow.  Final diagnoses:  Acute exacerbation of chronic low back pain   I personally performed the services described in this documentation, which was scribed in my presence. The recorded information has been reviewed and is accurate.   Shanon Rosser, MD 07/22/14 0003  Shanon Rosser, MD 07/22/14 [id number]

## 2014-07-21 NOTE — ED Notes (Signed)
Pt states she was at work, bent over had severe lower back pain; states hx of same, has a scheduled MRI for tomorrow

## 2014-07-22 ENCOUNTER — Ambulatory Visit (HOSPITAL_BASED_OUTPATIENT_CLINIC_OR_DEPARTMENT_OTHER)
Admission: RE | Admit: 2014-07-22 | Discharge: 2014-07-22 | Disposition: A | Discharge status: Home / Self Care | Payer: No Typology Code available for payment source | Source: Ambulatory Visit | Attending: Family Medicine | Admitting: Family Medicine

## 2014-07-22 ENCOUNTER — Emergency Department (HOSPITAL_BASED_OUTPATIENT_CLINIC_OR_DEPARTMENT_OTHER)
Admission: EM | Admit: 2014-07-22 | Discharge: 2014-07-22 | Disposition: A | Discharge status: Home / Self Care | Payer: No Typology Code available for payment source

## 2014-07-22 DIAGNOSIS — M47892 Other spondylosis, cervical region: Secondary | ICD-10-CM | POA: Diagnosis not present

## 2014-07-22 DIAGNOSIS — M4802 Spinal stenosis, cervical region: Secondary | ICD-10-CM | POA: Diagnosis not present

## 2014-07-22 DIAGNOSIS — G9589 Other specified diseases of spinal cord: Secondary | ICD-10-CM | POA: Insufficient documentation

## 2014-07-22 DIAGNOSIS — M542 Cervicalgia: Secondary | ICD-10-CM | POA: Diagnosis present

## 2014-07-25 NOTE — Addendum Note (Signed)
Addended by: Dene Gentry on: 07/25/2014 04:04 PM   Modules accepted: Miquel Dunn

## 2014-07-28 ENCOUNTER — Other Ambulatory Visit: Payer: Self-pay | Admitting: Family Medicine

## 2014-07-28 DIAGNOSIS — M5412 Radiculopathy, cervical region: Secondary | ICD-10-CM

## 2014-08-03 ENCOUNTER — Ambulatory Visit
Admission: RE | Admit: 2014-08-03 | Discharge: 2014-08-03 | Disposition: A | Discharge status: Home / Self Care | Payer: No Typology Code available for payment source | Source: Ambulatory Visit | Attending: Family Medicine | Admitting: Family Medicine

## 2014-08-03 DIAGNOSIS — M5412 Radiculopathy, cervical region: Secondary | ICD-10-CM

## 2014-08-03 MED ORDER — TRIAMCINOLONE ACETONIDE 40 MG/ML IJ SUSP (RADIOLOGY)
60.0000 mg | Freq: Once | INTRAMUSCULAR | Status: AC
  Administered 2014-08-03: 60 mg via EPIDURAL

## 2014-08-03 MED ORDER — IOHEXOL 300 MG/ML  SOLN
1.0000 mL | Freq: Once | INTRAMUSCULAR | Status: AC | PRN
  Administered 2014-08-03: 1 mL via EPIDURAL

## 2014-08-03 NOTE — Discharge Instructions (Signed)

## 2014-09-15 ENCOUNTER — Encounter (HOSPITAL_BASED_OUTPATIENT_CLINIC_OR_DEPARTMENT_OTHER): Payer: Self-pay | Admitting: *Deleted

## 2014-09-15 ENCOUNTER — Ambulatory Visit: Payer: No Typology Code available for payment source | Admitting: Family Medicine

## 2014-09-15 ENCOUNTER — Emergency Department (HOSPITAL_BASED_OUTPATIENT_CLINIC_OR_DEPARTMENT_OTHER)
Admission: EM | Admit: 2014-09-15 | Discharge: 2014-09-15 | Disposition: A | Discharge status: Home / Self Care | Payer: No Typology Code available for payment source | Attending: Emergency Medicine | Admitting: Emergency Medicine

## 2014-09-15 DIAGNOSIS — Z720 Tobacco use: Secondary | ICD-10-CM | POA: Diagnosis not present

## 2014-09-15 DIAGNOSIS — M549 Dorsalgia, unspecified: Secondary | ICD-10-CM

## 2014-09-15 DIAGNOSIS — G8929 Other chronic pain: Secondary | ICD-10-CM | POA: Diagnosis not present

## 2014-09-15 DIAGNOSIS — M545 Low back pain: Secondary | ICD-10-CM | POA: Diagnosis not present

## 2014-09-15 DIAGNOSIS — M542 Cervicalgia: Secondary | ICD-10-CM | POA: Insufficient documentation

## 2014-09-15 MED ORDER — HYDROMORPHONE HCL 1 MG/ML IJ SOLN
1.0000 mg | Freq: Once | INTRAMUSCULAR | Status: AC
  Administered 2014-09-15: 1 mg via INTRAMUSCULAR
  Filled 2014-09-15: qty 1

## 2014-09-15 MED ORDER — DEXAMETHASONE SODIUM PHOSPHATE 10 MG/ML IJ SOLN
10.0000 mg | Freq: Once | INTRAMUSCULAR | Status: AC
  Administered 2014-09-15: 10 mg via INTRAMUSCULAR
  Filled 2014-09-15: qty 1

## 2014-09-15 NOTE — ED Provider Notes (Signed)
CSN: [id number]     Arrival date & time 09/15/14  0955 History   First MD Initiated Contact with Patient 09/15/14 1011     No chief complaint on file.    (Consider location/radiation/quality/duration/timing/severity/associated sxs/prior Treatment) HPI Comments: Patient presents with neck and back pain. She has a history of chronic pain in her right neck and right lower back. She has radiation down her right arm with some intermittent numbness in her right hand. She also has some radiation to her right thigh. She denies any numbness in her leg. She denies any urinary or bowel incontinence. She denies any urinary symptoms. She denies any fevers or chills. She denies any abdominal pain. She sees Dr. Barbaraann Barthel for her neck and back pain. She had an MRI done in June and in early July she had an epidural injection. She states that the injection helped her pain but now it's back again. She feels like the injection is warm. It's the same symptoms that she had before. She denies any recent injuries. She has an appointment to see Dr. Barbaraann Barthel this morning. She thought that her appointment was at 10:00 that when she went there this morning, it was at 9:00. Due to the fact that she had missed her appointment, they were unable to see her and she came down here for assessment.   Past Medical History  Diagnosis Date  . Back pain   . Vertigo    Past Surgical History  Procedure Laterality Date  . Tubal ligation    . Cesarean section    . Ablation     No family history on file. History  Substance Use Topics  . Smoking status: Current Every Day Smoker -- 0.50 packs/day for 27 years    Types: Cigarettes  . Smokeless tobacco: Never Used  . Alcohol Use: 0.6 oz/week    1 Glasses of wine per week     Comment: occasions    OB History    No data available     Review of Systems  Constitutional: Negative for fever.  Gastrointestinal: Negative for nausea and vomiting.  Musculoskeletal: Positive for back pain  and neck pain.  Skin: Negative for wound.  Neurological: Positive for numbness. Negative for weakness and headaches.      Allergies  Augmentin; Keflex; Naproxen; and Zithromax  Home Medications   Prior to Admission medications   Medication Sig Start Date End Date Taking? Authorizing Provider  amitriptyline (ELAVIL) 10 MG tablet Take 10 mg by mouth at bedtime.   Yes Historical Provider, MD  cyclobenzaprine (FLEXERIL) 10 MG tablet Take 1 tablet (10 mg total) by mouth every 8 (eight) hours as needed. 07/12/14  Yes Dene Gentry, MD  meclizine (ANTIVERT) 25 MG tablet Take 25 mg by mouth 3 (three) times daily as needed for dizziness.   Yes Historical Provider, MD  temazepam (RESTORIL) 22.5 MG capsule  06/04/14  Yes Historical Provider, MD   BP 111/71 mmHg  Pulse 82  Temp(Src) 98.2 F (36.8 C) (Oral)  Resp 16  Ht 5\' 6"  (1.676 m)  Wt 148 lb (67.132 kg)  BMI 23.90 kg/m2  SpO2 100%  LMP 09/14/2014 Physical Exam  Constitutional: She is oriented to person, place, and time. She appears well-developed and well-nourished.  HENT:  Head: Normocephalic and atraumatic.  Neck: Normal range of motion. Neck supple.  Cardiovascular: Normal rate.   Pulmonary/Chest: Effort normal.  Musculoskeletal: She exhibits no edema or tenderness.  Patient has tenderness along the left cervical and upper  thoracic paraspinal muscles as well as along the left trapezius area. There is some tenderness along the left lumbar area as well. She has normal sensation and motor function in all extremities. Peripheral pulses are intact. Negative straight leg raise bilaterally. Patellar reflexes symmetric bilaterally.  Neurological: She is alert and oriented to person, place, and time.  Skin: Skin is warm and dry.  Psychiatric: She has a normal mood and affect.    ED Course  Procedures (including critical care time) Labs Review Labs Reviewed - No data to display  Imaging Review No results found.   EKG  Interpretation None      MDM   Final diagnoses:  Chronic back pain    Patient with an exacerbation of her chronic neck and back pain. She has no neurologic deficits or signs of cauda equina. She was given a shot of Dilaudid and Decadron in the ED. I did advise her that we don't prescribe pain management for chronic conditions. She will follow-up with Dr. Barbaraann Barthel on Monday.    Malvin Johns, MD 09/15/14 1024

## 2014-09-15 NOTE — Discharge Instructions (Signed)
Back Pain, Adult Low back pain is very common. About 1 in 5 people have back pain.The cause of low back pain is rarely dangerous. The pain often gets better over time.About half of people with a sudden onset of back pain feel better in just 2 weeks. About 8 in 10 people feel better by 6 weeks.  CAUSES Some common causes of back pain include:  Strain of the muscles or ligaments supporting the spine.  Wear and tear (degeneration) of the spinal discs.  Arthritis.  Direct injury to the back. DIAGNOSIS Most of the time, the direct cause of low back pain is not known.However, back pain can be treated effectively even when the exact cause of the pain is unknown.Answering your caregiver's questions about your overall health and symptoms is one of the most accurate ways to make sure the cause of your pain is not dangerous. If your caregiver needs more information, he or she may order lab work or imaging tests (X-rays or MRIs).However, even if imaging tests show changes in your back, this usually does not require surgery. HOME CARE INSTRUCTIONS For many people, back pain returns.Since low back pain is rarely dangerous, it is often a condition that people can learn to manageon their own.   Remain active. It is stressful on the back to sit or stand in one place. Do not sit, drive, or stand in one place for more than 30 minutes at a time. Take short walks on level surfaces as soon as pain allows.Try to increase the length of time you walk each day.  Do not stay in bed.Resting more than 1 or 2 days can delay your recovery.  Do not avoid exercise or work.Your body is made to move.It is not dangerous to be active, even though your back may hurt.Your back will likely heal faster if you return to being active before your pain is gone.  Pay attention to your body when you bend and lift. Many people have less discomfortwhen lifting if they bend their knees, keep the load close to their bodies,and  avoid twisting. Often, the most comfortable positions are those that put less stress on your recovering back.  Find a comfortable position to sleep. Use a firm mattress and lie on your side with your knees slightly bent. If you lie on your back, put a pillow under your knees.  Only take over-the-counter or prescription medicines as directed by your caregiver. Over-the-counter medicines to reduce pain and inflammation are often the most helpful.Your caregiver may prescribe muscle relaxant drugs.These medicines help dull your pain so you can more quickly return to your normal activities and healthy exercise.  Put ice on the injured area.  Put ice in a plastic bag.  Place a towel between your skin and the bag.  Leave the ice on for 15-20 minutes, 03-04 times a day for the first 2 to 3 days. After that, ice and heat may be alternated to reduce pain and spasms.  Ask your caregiver about trying back exercises and gentle massage. This may be of some benefit.  Avoid feeling anxious or stressed.Stress increases muscle tension and can worsen back pain.It is important to recognize when you are anxious or stressed and learn ways to manage it.Exercise is a great option. SEEK MEDICAL CARE IF:  You have pain that is not relieved with rest or medicine.  You have pain that does not improve in 1 week.  You have new symptoms.  You are generally not feeling well. SEEK   IMMEDIATE MEDICAL CARE IF:   You have pain that radiates from your back into your legs.  You develop new bowel or bladder control problems.  You have unusual weakness or numbness in your arms or legs.  You develop nausea or vomiting.  You develop abdominal pain.  You feel faint. Document Released: 01/27/2005 Document Revised: 07/29/2011 Document Reviewed: 05/31/2013 [employer] Patient Information 2015 [employer]. This information is not intended to replace advice given to you by your health care provider. Make sure you  discuss any questions you have with your health care provider.  

## 2014-09-15 NOTE — ED Notes (Signed)
C/o low back pain and neck pain and right shoulder, numbness in right arm. States she has been treated for same x 1 year and has had shots in neck. Also c/o pain in inner right thigh.

## 2014-09-18 ENCOUNTER — Encounter: Payer: Self-pay | Admitting: Family Medicine

## 2014-09-18 ENCOUNTER — Ambulatory Visit (INDEPENDENT_AMBULATORY_CARE_PROVIDER_SITE_OTHER): Payer: No Typology Code available for payment source | Admitting: Family Medicine

## 2014-09-18 VITALS — BP 114/73 | HR 86 | Ht 67.0 in | Wt 148.0 lb

## 2014-09-18 DIAGNOSIS — M545 Low back pain, unspecified: Secondary | ICD-10-CM

## 2014-09-18 DIAGNOSIS — M542 Cervicalgia: Secondary | ICD-10-CM | POA: Diagnosis not present

## 2014-09-18 MED ORDER — PREDNISONE 10 MG PO TABS: ORAL_TABLET | ORAL | Status: DC

## 2014-09-18 NOTE — Assessment & Plan Note (Signed)
2/2 cervical radiculopathy.  Improved with ESI but pain has returned.  Will repeat this at level of C5-6 on right.

## 2014-09-18 NOTE — Assessment & Plan Note (Signed)
with radiation into right greater than left legs.  Will prescribed prednisone dose pack, go ahead with MRI (had ordered previously since she had tried nsaids, home exercises, prednisone).

## 2014-09-18 NOTE — Patient Instructions (Signed)
You have cervical and lumbar radiculopathy (pinched nerves in your neck and low back). Take tylenol for baseline pain relief (1-2 extra strength tabs 3x/day) A prednisone dose pack is the best option for immediate relief and may be prescribed. Day after finishing prednisone start aleve or ibuprofen with food for pain and inflammation. Consider robaxin or flexeril as needed for muscle spasms (no driving on this medicine if it makes you sleepy). Stay as active as possible. Physical therapy has been shown to be helpful as well. Strengthening of low back muscles, abdominal musculature are key for long term pain relief. We will set up another injection for your neck and get approval for the MRI of your low back.

## 2014-09-18 NOTE — Progress Notes (Addendum)
PCP: PALMER, CHARLES  Subjective:   HPI: Patient is a 44 y.o. female here for right shoulder, neck pain.  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 Now has associated burning into her right arm and hand. Tried norco, icing, heat, aleve, flexeril. In 2013 did PT for this but also for her low back. Had an MRI in 2013 and was told she had a pinched nerve in her low back but then told she did not. Has had a cortisone injection in right shoulder without much benefit. No radiation into legs. No bowel/bladder dysfunction.  1/4: Patient reports she's not had much change in neck or back pain since last visit. Both about 7/10 level of pain. Finished prednisone. Gets burning sensation into both arms and legs. Doing home exercises and stretches since last visit. Went to ED on 12/31 due to pain. Tried prednisone, voltaren, flexeril, percocet, norco since last visit. No bowel/bladder dysfunction.  6/1: Patient never went ahead with MRI of her cervical spine due to cost though interested in pursuing this now. Still with pain into right side of neck into shoulder. Associated burning. Previously tried prednisone, voltaren, flexeril, pain medication. Radiation goes to fingertips with numbness on right side. No bowel/bladder dysfunction.  8/8: Patient returns stating injection helped until about 1 week ago for her neck. Started to get pain in right side of neck into arm and fingers. Numbness into fingertips again. No bowel/bladder dysfunction. She also reports low back pain has been worsening - been to ED twice since last visit here for this. Goes into both legs but more into the right. Decreased sensation medially in right leg.  Past Medical History  Diagnosis Date  . Back pain   . Vertigo     Current Outpatient Prescriptions on File Prior to Visit  Medication Sig Dispense Refill   . amitriptyline (ELAVIL) 10 MG tablet Take 10 mg by mouth at bedtime.    . cyclobenzaprine (FLEXERIL) 10 MG tablet Take 1 tablet (10 mg total) by mouth every 8 (eight) hours as needed. 60 tablet 1  . meclizine (ANTIVERT) 25 MG tablet Take 25 mg by mouth 3 (three) times daily as needed for dizziness.    . temazepam (RESTORIL) 22.5 MG capsule   5   No current facility-administered medications on file prior to visit.    Past Surgical History  Procedure Laterality Date  . Tubal ligation    . Cesarean section    . Ablation      Allergies  Allergen Reactions  . Augmentin [Amoxicillin-Pot Clavulanate] Hives  . Keflex [Cephalexin] Hives and Other (See Comments)    Also respiratory depression  . Naproxen Hives  . Zithromax [Azithromycin] Hives    History   Social History  . Marital Status: Legally Separated    Spouse Name: N/A  . Number of Children: N/A  . Years of Education: N/A   Occupational History  . Not on file.   Social History Main Topics  . Smoking status: Current Every Day Smoker -- 0.50 packs/day for 27 years    Types: Cigarettes  . Smokeless tobacco: Never Used  . Alcohol Use: 0.6 oz/week    1 Glasses of wine per week     Comment: occasions   . Drug Use: No  . Sexual Activity: Yes    Birth Control/ Protection: Surgical   Other Topics Concern  . Not  on file   Social History Narrative    No family history on file.  BP 114/73 mmHg  Pulse 86  Ht 5\' 7"  (1.702 m)  Wt 148 lb (67.132 kg)  BMI 23.17 kg/m2  LMP 09/14/2014  Review of Systems: See HPI above.    Objective:  Physical Exam:  Gen: NAD  Neck: No gross deformity, swelling, bruising. TTP right > left cervical paraspinal region.  No midline/bony TTP. FROM neck - pain flexion and extension. BUE strength 5/5.   Sensation intact to light touch currently.   1+ equal reflexes in biceps, brachioradialis tendons.  Trace right triceps, 1+ left triceps. Negative spurlings. NV intact distal  BUEs.  Back: No gross deformity, scoliosis. TTP bilateral paraspinal regions.  No midline or bony TTP. FROM. Strength LEs 5/5 all muscle groups.   2+ MSRs in patellar and achilles tendons, equal bilaterally. Negative SLRs. Sensation diminished to light touch medial right lower leg. Negative logroll bilateral hips Negative fabers and piriformis stretches.    Assessment & Plan:  1. Neck pain - 2/2 cervical radiculopathy.  Improved with ESI but pain has returned.  Will repeat this at level of C5-6 on right.    2. Low back pain - with radiation into right greater than left legs.  Will prescribed prednisone dose pack, go ahead with MRI (had ordered previously since she had tried nsaids, home exercises, prednisone).    Addendum:  MRI reviewed and discussed with patient.  Looks similar to MRI from 2 years ago.  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  Discussed options - she would like to try an ESI at this level - will arrange.  Call us in 1-2 weeks to let us know how she's doing.

## 2014-09-23 ENCOUNTER — Ambulatory Visit (HOSPITAL_BASED_OUTPATIENT_CLINIC_OR_DEPARTMENT_OTHER): Payer: No Typology Code available for payment source

## 2014-09-30 ENCOUNTER — Ambulatory Visit (HOSPITAL_BASED_OUTPATIENT_CLINIC_OR_DEPARTMENT_OTHER)
Admission: RE | Admit: 2014-09-30 | Discharge: 2014-09-30 | Disposition: A | Discharge status: Home / Self Care | Payer: No Typology Code available for payment source | Source: Ambulatory Visit | Attending: Family Medicine | Admitting: Family Medicine

## 2014-09-30 DIAGNOSIS — M5137 Other intervertebral disc degeneration, lumbosacral region: Secondary | ICD-10-CM | POA: Insufficient documentation

## 2014-09-30 DIAGNOSIS — M5127 Other intervertebral disc displacement, lumbosacral region: Secondary | ICD-10-CM | POA: Insufficient documentation

## 2014-09-30 DIAGNOSIS — M5441 Lumbago with sciatica, right side: Secondary | ICD-10-CM

## 2014-09-30 DIAGNOSIS — M5442 Lumbago with sciatica, left side: Secondary | ICD-10-CM

## 2014-09-30 DIAGNOSIS — M5440 Lumbago with sciatica, unspecified side: Secondary | ICD-10-CM | POA: Diagnosis present

## 2014-10-06 ENCOUNTER — Telehealth: Payer: Self-pay | Admitting: Family Medicine

## 2014-10-06 ENCOUNTER — Other Ambulatory Visit: Payer: Self-pay | Admitting: Family Medicine

## 2014-10-06 DIAGNOSIS — M5416 Radiculopathy, lumbar region: Secondary | ICD-10-CM

## 2014-10-06 DIAGNOSIS — M5412 Radiculopathy, cervical region: Secondary | ICD-10-CM

## 2014-10-09 NOTE — Addendum Note (Signed)
Addended by: Dene Gentry on: 10/09/2014 09:38 AM   Modules accepted: Miquel Dunn

## 2014-10-09 NOTE — Telephone Encounter (Signed)
Spoke to patient and she is aware of the appointment dates and times.

## 2014-10-10 ENCOUNTER — Ambulatory Visit
Admission: RE | Admit: 2014-10-10 | Discharge: 2014-10-10 | Disposition: A | Discharge status: Home / Self Care | Payer: No Typology Code available for payment source | Source: Ambulatory Visit | Attending: Family Medicine | Admitting: Family Medicine

## 2014-10-10 DIAGNOSIS — M5416 Radiculopathy, lumbar region: Secondary | ICD-10-CM

## 2014-10-10 MED ORDER — METHYLPREDNISOLONE ACETATE 40 MG/ML INJ SUSP (RADIOLOG
120.0000 mg | Freq: Once | INTRAMUSCULAR | Status: AC
  Administered 2014-10-10: 120 mg via EPIDURAL

## 2014-10-10 MED ORDER — IOHEXOL 180 MG/ML  SOLN
1.0000 mL | Freq: Once | INTRAMUSCULAR | Status: DC | PRN
  Administered 2014-10-10: 1 mL via EPIDURAL

## 2014-10-10 NOTE — Discharge Instructions (Signed)

## 2014-10-24 ENCOUNTER — Ambulatory Visit
Admission: RE | Admit: 2014-10-24 | Discharge: 2014-10-24 | Disposition: A | Discharge status: Home / Self Care | Payer: No Typology Code available for payment source | Source: Ambulatory Visit | Attending: Family Medicine | Admitting: Family Medicine

## 2014-10-24 DIAGNOSIS — M5412 Radiculopathy, cervical region: Secondary | ICD-10-CM

## 2014-10-24 MED ORDER — IOHEXOL 300 MG/ML  SOLN
1.0000 mL | Freq: Once | INTRAMUSCULAR | Status: DC | PRN
  Administered 2014-10-24: 1 mL via EPIDURAL

## 2014-10-24 MED ORDER — TRIAMCINOLONE ACETONIDE 40 MG/ML IJ SUSP (RADIOLOGY)
60.0000 mg | Freq: Once | INTRAMUSCULAR | Status: AC
  Administered 2014-10-24: 60 mg via EPIDURAL

## 2015-01-12 ENCOUNTER — Telehealth: Payer: Self-pay | Admitting: Family Medicine

## 2015-01-15 ENCOUNTER — Other Ambulatory Visit: Payer: Self-pay | Admitting: Family Medicine

## 2015-01-15 DIAGNOSIS — M5412 Radiculopathy, cervical region: Secondary | ICD-10-CM

## 2015-01-15 DIAGNOSIS — M545 Low back pain, unspecified: Secondary | ICD-10-CM

## 2015-01-15 DIAGNOSIS — G8929 Other chronic pain: Secondary | ICD-10-CM

## 2015-01-15 NOTE — Telephone Encounter (Signed)
They would be right sided L5-S1 and C7-T1 ESIs.  Thanks!

## 2015-02-05 ENCOUNTER — Emergency Department (HOSPITAL_BASED_OUTPATIENT_CLINIC_OR_DEPARTMENT_OTHER)
Admission: EM | Admit: 2015-02-05 | Discharge: 2015-02-05 | Disposition: A | Discharge status: Home / Self Care | Payer: No Typology Code available for payment source | Attending: Emergency Medicine | Admitting: Emergency Medicine

## 2015-02-05 ENCOUNTER — Emergency Department (HOSPITAL_BASED_OUTPATIENT_CLINIC_OR_DEPARTMENT_OTHER): Payer: No Typology Code available for payment source

## 2015-02-05 ENCOUNTER — Encounter (HOSPITAL_BASED_OUTPATIENT_CLINIC_OR_DEPARTMENT_OTHER): Payer: Self-pay | Admitting: *Deleted

## 2015-02-05 ENCOUNTER — Emergency Department (HOSPITAL_BASED_OUTPATIENT_CLINIC_OR_DEPARTMENT_OTHER): Payer: BLUE CROSS/BLUE SHIELD

## 2015-02-05 ENCOUNTER — Emergency Department (HOSPITAL_BASED_OUTPATIENT_CLINIC_OR_DEPARTMENT_OTHER)
Admission: EM | Admit: 2015-02-05 | Discharge: 2015-02-06 | Disposition: A | Discharge status: Home / Self Care | Payer: BLUE CROSS/BLUE SHIELD | Attending: Emergency Medicine | Admitting: Emergency Medicine

## 2015-02-05 ENCOUNTER — Encounter (HOSPITAL_BASED_OUTPATIENT_CLINIC_OR_DEPARTMENT_OTHER): Payer: Self-pay

## 2015-02-05 DIAGNOSIS — R519 Headache, unspecified: Secondary | ICD-10-CM

## 2015-02-05 DIAGNOSIS — M542 Cervicalgia: Secondary | ICD-10-CM | POA: Diagnosis not present

## 2015-02-05 DIAGNOSIS — F17210 Nicotine dependence, cigarettes, uncomplicated: Secondary | ICD-10-CM | POA: Insufficient documentation

## 2015-02-05 DIAGNOSIS — M25511 Pain in right shoulder: Secondary | ICD-10-CM | POA: Insufficient documentation

## 2015-02-05 DIAGNOSIS — Z3202 Encounter for pregnancy test, result negative: Secondary | ICD-10-CM | POA: Insufficient documentation

## 2015-02-05 DIAGNOSIS — R112 Nausea with vomiting, unspecified: Secondary | ICD-10-CM | POA: Insufficient documentation

## 2015-02-05 DIAGNOSIS — R1011 Right upper quadrant pain: Secondary | ICD-10-CM | POA: Insufficient documentation

## 2015-02-05 DIAGNOSIS — Z79899 Other long term (current) drug therapy: Secondary | ICD-10-CM | POA: Diagnosis not present

## 2015-02-05 DIAGNOSIS — R51 Headache: Secondary | ICD-10-CM | POA: Insufficient documentation

## 2015-02-05 DIAGNOSIS — G8929 Other chronic pain: Secondary | ICD-10-CM | POA: Diagnosis not present

## 2015-02-05 DIAGNOSIS — R197 Diarrhea, unspecified: Secondary | ICD-10-CM | POA: Insufficient documentation

## 2015-02-05 DIAGNOSIS — M549 Dorsalgia, unspecified: Secondary | ICD-10-CM | POA: Insufficient documentation

## 2015-02-05 HISTORY — DX: Migraine, unspecified, not intractable, without status migrainosus: G43.909

## 2015-02-05 LAB — URINALYSIS, ROUTINE W REFLEX MICROSCOPIC
Bilirubin Urine: NEGATIVE
GLUCOSE, UA: NEGATIVE mg/dL
HGB URINE DIPSTICK: NEGATIVE
Ketones, ur: NEGATIVE mg/dL
Leukocytes, UA: NEGATIVE
Nitrite: NEGATIVE
Protein, ur: NEGATIVE mg/dL
SPECIFIC GRAVITY, URINE: 1.015 (ref 1.005–1.030)
pH: 8 (ref 5.0–8.0)

## 2015-02-05 LAB — BASIC METABOLIC PANEL
ANION GAP: 5 (ref 5–15)
BUN: 12 mg/dL (ref 6–20)
CHLORIDE: 106 mmol/L (ref 101–111)
CO2: 29 mmol/L (ref 22–32)
Calcium: 8.6 mg/dL — ABNORMAL LOW (ref 8.9–10.3)
Creatinine, Ser: 0.75 mg/dL (ref 0.44–1.00)
GFR calc non Af Amer: >60 mL/min (ref >60)
Glucose, Bld: 112 mg/dL — ABNORMAL HIGH (ref 65–99)
Potassium: 3.2 mmol/L — ABNORMAL LOW (ref 3.5–5.1)
Sodium: 140 mmol/L (ref 135–145)

## 2015-02-05 LAB — CBC WITH DIFFERENTIAL/PLATELET
BASOS ABS: 0 10*3/uL (ref 0.0–0.1)
BASOS PCT: 0 %
Eosinophils Absolute: 0.4 10*3/uL (ref 0.0–0.7)
Eosinophils Relative: 4 %
HEMATOCRIT: 35.9 % — AB (ref 36.0–46.0)
HEMOGLOBIN: 11.8 g/dL — AB (ref 12.0–15.0)
Lymphocytes Relative: 33 %
Lymphs Abs: 3.2 10*3/uL (ref 0.7–4.0)
MCH: 30.4 pg (ref 26.0–34.0)
MCHC: 32.9 g/dL (ref 30.0–36.0)
MCV: 92.5 fL (ref 78.0–100.0)
MONOS PCT: 8 %
Monocytes Absolute: 0.8 10*3/uL (ref 0.1–1.0)
NEUTROS ABS: 5.4 10*3/uL (ref 1.7–7.7)
NEUTROS PCT: 55 %
Platelets: 201 10*3/uL (ref 150–400)
RBC: 3.88 MIL/uL (ref 3.87–5.11)
RDW: 12.6 % (ref 11.5–15.5)
WBC: 9.7 10*3/uL (ref 4.0–10.5)

## 2015-02-05 LAB — HEPATIC FUNCTION PANEL
ALBUMIN: 3.7 g/dL (ref 3.5–5.0)
ALK PHOS: 71 U/L (ref 38–126)
ALT: 13 U/L — ABNORMAL LOW (ref 14–54)
AST: 14 U/L — AB (ref 15–41)
BILIRUBIN TOTAL: 0.3 mg/dL (ref 0.3–1.2)
Bilirubin, Direct: 0.1 mg/dL (ref 0.1–0.5)
Indirect Bilirubin: 0.2 mg/dL — ABNORMAL LOW (ref 0.3–0.9)
Total Protein: 6.7 g/dL (ref 6.5–8.1)

## 2015-02-05 LAB — PREGNANCY, URINE: Preg Test, Ur: NEGATIVE

## 2015-02-05 LAB — LIPASE, BLOOD: Lipase: 30 U/L (ref 11–51)

## 2015-02-05 MED ORDER — CYCLOBENZAPRINE HCL 10 MG PO TABS: 10.0000 mg | ORAL_TABLET | Freq: Three times a day (TID) | ORAL | Status: DC | PRN

## 2015-02-05 MED ORDER — SODIUM CHLORIDE 0.9 % IV BOLUS (SEPSIS)
1000.0000 mL | Freq: Once | INTRAVENOUS | Status: AC
  Administered 2015-02-06: 1000 mL via INTRAVENOUS

## 2015-02-05 MED ORDER — MORPHINE SULFATE (PF) 4 MG/ML IV SOLN
4.0000 mg | Freq: Once | INTRAVENOUS | Status: AC
  Administered 2015-02-05: 4 mg via INTRAVENOUS
  Filled 2015-02-05: qty 1

## 2015-02-05 MED ORDER — DIPHENHYDRAMINE HCL 50 MG/ML IJ SOLN
25.0000 mg | Freq: Once | INTRAMUSCULAR | Status: AC
  Administered 2015-02-05: 25 mg via INTRAVENOUS
  Filled 2015-02-05: qty 1

## 2015-02-05 MED ORDER — DEXAMETHASONE SODIUM PHOSPHATE 10 MG/ML IJ SOLN
10.0000 mg | Freq: Once | INTRAMUSCULAR | Status: AC
  Administered 2015-02-05: 10 mg via INTRAVENOUS
  Filled 2015-02-05: qty 1

## 2015-02-05 MED ORDER — METOCLOPRAMIDE HCL 5 MG/ML IJ SOLN
10.0000 mg | Freq: Once | INTRAMUSCULAR | Status: AC
  Administered 2015-02-05: 10 mg via INTRAVENOUS
  Filled 2015-02-05: qty 2

## 2015-02-05 MED ORDER — IOHEXOL 300 MG/ML  SOLN
25.0000 mL | Freq: Once | INTRAMUSCULAR | Status: AC | PRN
  Administered 2015-02-05: 25 mL via ORAL

## 2015-02-05 MED ORDER — IOHEXOL 300 MG/ML  SOLN
100.0000 mL | Freq: Once | INTRAMUSCULAR | Status: AC | PRN
  Administered 2015-02-05: 100 mL via INTRAVENOUS

## 2015-02-05 MED ORDER — HYDROCODONE-ACETAMINOPHEN 5-325 MG PO TABS: 1.0000 | ORAL_TABLET | ORAL | Status: DC | PRN

## 2015-02-05 MED ORDER — ONDANSETRON HCL 4 MG/2ML IJ SOLN
4.0000 mg | Freq: Once | INTRAMUSCULAR | Status: AC
  Administered 2015-02-05: 4 mg via INTRAVENOUS
  Filled 2015-02-05: qty 2

## 2015-02-05 NOTE — ED Provider Notes (Signed)
CSN: [id number]     Arrival date & time 02/05/15  [id number] History   First MD Initiated Contact with Patient 02/05/15 0408     Chief Complaint  Patient presents with  . Abdominal Pain     (Consider location/radiation/quality/duration/timing/severity/associated sxs/prior Treatment) Patient is a 44 y.o. female presenting with abdominal pain. The history is provided by the patient.  Abdominal Pain Associated symptoms: diarrhea, nausea and vomiting   Associated symptoms: no shortness of breath    patient presents with abdominal pain. It is in her right upper abdomen and right flank. The last 4 days. He's had nausea without vomiting. Sometimes has diarrhea sometimes has constipation. No fevers. No change with eating. She said that she had some sort of pain like this in the past and was something with a kidney cyst she thinks. The pain is constant. Worse with certain movements. Also has right shoulder pain and neck pain. She's had this for over 3 years. Seen by Dr. Barbaraann Barthel has had injections. Has known bulging disc. States she's gotten worse the last few days. No trauma. No fall.  Past Medical History  Diagnosis Date  . Back pain   . Vertigo    Past Surgical History  Procedure Laterality Date  . Tubal ligation    . Cesarean section    . Ablation     No family history on file. Social History  Substance Use Topics  . Smoking status: Current Every Day Smoker -- 0.50 packs/day for 27 years    Types: Cigarettes  . Smokeless tobacco: Never Used  . Alcohol Use: 0.6 oz/week    1 Glasses of wine per week     Comment: occasions    OB History    No data available     Review of Systems  Constitutional: Negative for diaphoresis.  Respiratory: Negative for shortness of breath and wheezing.   Gastrointestinal: Positive for nausea, vomiting, abdominal pain and diarrhea. Negative for blood in stool.  Genitourinary: Positive for flank pain.  Musculoskeletal: Positive for back pain and neck pain.   Skin: Negative for color change and wound.  Neurological: Negative for numbness.      Allergies  Augmentin; Keflex; Naproxen; and Zithromax  Home Medications   Prior to Admission medications   Medication Sig Start Date End Date Taking? Authorizing Provider  amitriptyline (ELAVIL) 10 MG tablet Take 10 mg by mouth at bedtime.    Historical Provider, MD  cyclobenzaprine (FLEXERIL) 10 MG tablet Take 1 tablet (10 mg total) by mouth every 8 (eight) hours as needed. 07/12/14   Dene Gentry, MD  meclizine (ANTIVERT) 25 MG tablet Take 25 mg by mouth 3 (three) times daily as needed for dizziness.    Historical Provider, MD  predniSONE (DELTASONE) 10 MG tablet 6 tabs po day 1, 5 tabs po day 2, 4 tabs po day 3, 3 tabs po day 4, 2 tabs po day 5, 1 tab po day 6 09/18/14   Dene Gentry, MD  SUMAtriptan (IMITREX) 50 MG tablet  07/17/14   Historical Provider, MD  temazepam (RESTORIL) 22.5 MG capsule  06/04/14   Historical Provider, MD   BP 139/91 mmHg  Pulse 72  Temp(Src) 98.2 F (36.8 C) (Oral)  Resp 18  Ht 5\' 7"  (1.702 m)  Wt 152 lb (68.947 kg)  BMI 23.80 kg/m2  SpO2 98%  LMP 01/09/2015 Physical Exam  Constitutional: She appears well-developed.  HENT:  Head: Atraumatic.  Cardiovascular: Normal rate.   Pulmonary/Chest: Effort normal.  Abdominal: There is tenderness.  Right upper quadrant and right CVA tenderness. No rebound or guarding. No mass.  Musculoskeletal: She exhibits tenderness.  Moderate tenderness to right trapezius area. Some pain with movement of the arm. Neurovascular intact in hand.  Neurological: She is alert.  Skin: Skin is warm.    ED Course  Procedures (including critical care time) Labs Review Labs Reviewed  CBC WITH DIFFERENTIAL/PLATELET - Abnormal; Notable for the following:    Hemoglobin 11.8 (*)    HCT 35.9 (*)    All other components within normal limits  BASIC METABOLIC PANEL - Abnormal; Notable for the following:    Potassium 3.2 (*)    Glucose, Bld  112 (*)    Calcium 8.6 (*)    All other components within normal limits  HEPATIC FUNCTION PANEL - Abnormal; Notable for the following:    AST 14 (*)    ALT 13 (*)    Indirect Bilirubin 0.2 (*)    All other components within normal limits  URINALYSIS, ROUTINE W REFLEX MICROSCOPIC (NOT AT [facility]) - Abnormal; Notable for the following:    APPearance CLOUDY (*)    All other components within normal limits  PREGNANCY, URINE  LIPASE, BLOOD    Imaging Review No results found. I have personally reviewed and evaluated these images and lab results as part of my medical decision-making.   EKG Interpretation None      MDM   Final diagnoses:  None   patient with abdominal pain.in upper abdomen and flank. Lab work overall reassuring, however has a fair amount of tenderness. Will get CT scan. Also has right neck pain which is chronic.    Davonna Belling, MD 02/05/15 [phone]

## 2015-02-05 NOTE — ED Notes (Signed)
Pt seen here last night for neck and side pain- Pt went to work today at [employer]- Had to leave work due to severe headache that started around [place] picked her up from work and brought her to ED

## 2015-02-05 NOTE — ED Notes (Signed)
Patient transported to CT 

## 2015-02-05 NOTE — ED Notes (Signed)
C/o rt side abd pain x 4 days w nausea,  Denies v/d,  No diff w urination,   Also rt shoulder pain and neck pain being treated by dr Koleen Distance

## 2015-02-05 NOTE — Discharge Instructions (Signed)
Abdominal Pain, Adult Many things can cause abdominal pain. Usually, abdominal pain is not caused by a disease and will improve without treatment. It can often be observed and treated at home. Your health care provider will do a physical exam and possibly order blood tests and X-rays to help determine the seriousness of your pain. However, in many cases, more time must pass before a clear cause of the pain can be found. Before that point, your health care provider may not know if you need more testing or further treatment. HOME CARE INSTRUCTIONS Monitor your abdominal pain for any changes. The following actions may help to alleviate any discomfort you are experiencing:  Only take over-the-counter or prescription medicines as directed by your health care provider.  Do not take laxatives unless directed to do so by your health care provider.  Try a clear liquid diet (broth, tea, or water) as directed by your health care provider. Slowly move to a bland diet as tolerated. SEEK MEDICAL CARE IF:  You have unexplained abdominal pain.  You have abdominal pain associated with nausea or diarrhea.  You have pain when you urinate or have a bowel movement.  You experience abdominal pain that wakes you in the night.  You have abdominal pain that is worsened or improved by eating food.  You have abdominal pain that is worsened with eating fatty foods.  You have a fever. SEEK IMMEDIATE MEDICAL CARE IF:  Your pain does not go away within 2 hours.  You keep throwing up (vomiting).  Your pain is felt only in portions of the abdomen, such as the right side or the left lower portion of the abdomen.  You pass bloody or black tarry stools. MAKE SURE YOU:  Understand these instructions.  Will watch your condition.  Will get help right away if you are not doing well or get worse.   This information is not intended to replace advice given to you by your health care provider. Make sure you discuss  any questions you have with your health care provider.   Document Released: 11/06/2004 Document Revised: 10/18/2014 Document Reviewed: 10/06/2012 Elsevier Interactive Patient Education 2016 [employer]  Radicular Pain Radicular pain in either the arm or leg is usually from a bulging or herniated disk in the spine. A piece of the herniated disk may press against the nerves as the nerves exit the spine. This causes pain which is felt at the tips of the nerves down the arm or leg. Other causes of radicular pain may include:  Fractures.  Heart disease.  Cancer.  An abnormal and usually degenerative state of the nervous system or nerves (neuropathy). Diagnosis may require CT or MRI scanning to determine the primary cause.  Nerves that start at the neck (nerve roots) may cause radicular pain in the outer shoulder and arm. It can spread down to the thumb and fingers. The symptoms vary depending on which nerve root has been affected. In most cases radicular pain improves with conservative treatment. Neck problems may require physical therapy, a neck collar, or cervical traction. Treatment may take many weeks, and surgery may be considered if the symptoms do not improve.  Conservative treatment is also recommended for sciatica. Sciatica causes pain to radiate from the lower back or buttock area down the leg into the foot. Often there is a history of back problems. Most patients with sciatica are better after 2 to 4 weeks of rest and other supportive care. Short term bed rest can reduce  the disk pressure considerably. Sitting, however, is not a good position since this increases the pressure on the disk. You should avoid bending, lifting, and all other activities which make the problem worse. Traction can be used in severe cases. Surgery is usually reserved for patients who do not improve within the first months of treatment. Only take over-the-counter or prescription medicines for pain, discomfort, or  fever as directed by your caregiver. Narcotics and muscle relaxants may help by relieving more severe pain and spasm and by providing mild sedation. Cold or massage can give significant relief. Spinal manipulation is not recommended. It can increase the degree of disc protrusion. Epidural steroid injections are often effective treatment for radicular pain. These injections deliver medicine to the spinal nerve in the space between the protective covering of the spinal cord and back bones (vertebrae). Your caregiver can give you more information about steroid injections. These injections are most effective when given within two weeks of the onset of pain.  You should see your caregiver for follow up care as recommended. A program for neck and back injury rehabilitation with stretching and strengthening exercises is an important part of management.  SEEK IMMEDIATE MEDICAL CARE IF:  You develop increased pain, weakness, or numbness in your arm or leg.  You develop difficulty with bladder or bowel control.  You develop abdominal pain.   This information is not intended to replace advice given to you by your health care provider. Make sure you discuss any questions you have with your health care provider.   Document Released: 03/06/2004 Document Revised: 02/17/2014 Document Reviewed: 08/23/2014 Elsevier Interactive Patient Education [employer].

## 2015-02-05 NOTE — ED Notes (Signed)
Reports RUQ pain with bloating and pain radiating into right shoulder that started on Thursday night.  Reports nausea but denies vomiting.

## 2015-02-05 NOTE — ED Notes (Signed)
Patient states she has abdominal pain as well as right shoulder pain, chief complaint is both symptoms.

## 2015-02-05 NOTE — ED Provider Notes (Signed)
CSN: [id number]     Arrival date & time 02/05/15  2045 History  By signing my name below, I, Shelby Moreno, attest that this documentation has been prepared under the direction and in the presence of Shelby Speak, MD. Electronically Signed: Altamease Moreno, ED Scribe. 02/06/2015. 12:19 AM   Chief Complaint  Patient presents with  . Headache    The history is provided by the patient. No language interpreter was used.   Shelby Moreno is a 44 y.o. female with history of migraines who presents to the Emergency Department complaining of constant, throbbing/aching, 10/10 in severity, headache with onset at approximately 7:30 PM tonight. The pain starts at the neck and spreads. She took nothing for pain PTA. This pain is similar in quality to her migraines but more severe.  Associated symptoms include nausea and blurred vision. Also complains of right arm pain. Pt denies vomiting.   Past Medical History  Diagnosis Date  . Back pain   . Vertigo   . Migraine    Past Surgical History  Procedure Laterality Date  . Tubal ligation    . Cesarean section    . Ablation     No family history on file. Social History  Substance Use Topics  . Smoking status: Current Every Day Smoker -- 0.50 packs/day for 27 years    Types: Cigarettes  . Smokeless tobacco: Never Used  . Alcohol Use: 0.6 oz/week    1 Glasses of wine per week     Comment: occasions    OB History    No data available     Review of Systems 10 Systems reviewed and all are negative for acute change except as noted in the HPI. Allergies  Augmentin; Keflex; Naproxen; and Zithromax  Home Medications   Prior to Admission medications   Medication Sig Start Date End Date Taking? Authorizing Provider  amitriptyline (ELAVIL) 10 MG tablet Take 10 mg by mouth at bedtime.   Yes Historical Provider, MD  meclizine (ANTIVERT) 25 MG tablet Take 25 mg by mouth 3 (three) times daily as needed for dizziness.   Yes Historical Provider, MD   SUMAtriptan (IMITREX) 50 MG tablet  07/17/14  Yes Historical Provider, MD  cyclobenzaprine (FLEXERIL) 10 MG tablet Take 1 tablet (10 mg total) by mouth every 8 (eight) hours as needed. 02/05/15   Davonna Belling, MD  HYDROcodone-acetaminophen (NORCO/VICODIN) 5-325 MG tablet Take 1-2 tablets by mouth every 4 (four) hours as needed. 02/05/15   Davonna Belling, MD  predniSONE (DELTASONE) 10 MG tablet 6 tabs po day 1, 5 tabs po day 2, 4 tabs po day 3, 3 tabs po day 4, 2 tabs po day 5, 1 tab po day 6 09/18/14   Dene Gentry, MD  temazepam (RESTORIL) 22.5 MG capsule  06/04/14   Historical Provider, MD   BP 138/82 mmHg  Pulse 83  Temp(Src) 99 F (37.2 C) (Oral)  Resp 18  Ht 5\' 7"  (1.702 m)  Wt 152 lb (68.947 kg)  BMI 23.80 kg/m2  SpO2 99%  LMP 01/09/2015 Physical Exam  Constitutional: She is oriented to person, place, and time. She appears well-developed and well-nourished. No distress.  HENT:  Head: Normocephalic and atraumatic.  Eyes: EOM are normal. Pupils are equal, round, and reactive to light.  Neck: Normal range of motion.  Cardiovascular: Normal rate, regular rhythm and normal heart sounds.   Pulmonary/Chest: Effort normal and breath sounds normal.  Abdominal: Soft. She exhibits no distension. There is no tenderness.  Musculoskeletal:  Normal range of motion.  Neurological: She is alert and oriented to person, place, and time. No cranial nerve deficit. She exhibits normal muscle tone. Coordination normal.  Skin: Skin is warm and dry.  Psychiatric: She has a normal mood and affect. Judgment normal.  Nursing note and vitals reviewed.   ED Course  Procedures (including critical care time) DIAGNOSTIC STUDIES: Oxygen Saturation is 99% on RA,  normal by my interpretation.    COORDINATION OF CARE: 11:03 PM Discussed treatment plan which includes CT head and a migraine cocktail with pt at bedside and pt agreed to plan.  12:18 AM I re-evaluated the patient and provided an update on  the results of her CT head.   Labs Review Labs Reviewed - No data to display  Imaging Review Ct Head Wo Contrast  02/05/2015  CLINICAL DATA:  Acute onset of migraine headache. Initial encounter. EXAM: CT HEAD WITHOUT CONTRAST TECHNIQUE: Contiguous axial images were obtained from the base of the skull through the vertex without intravenous contrast. COMPARISON:  CT of the head performed 10/10/2013 FINDINGS: There is no evidence of acute infarction, mass lesion, or intra- or extra-axial hemorrhage on CT. The posterior fossa, including the cerebellum, brainstem and fourth ventricle, is within normal limits. The third and lateral ventricles, and basal ganglia are unremarkable in appearance. The cerebral hemispheres are symmetric in appearance, with normal gray-white differentiation. No mass effect or midline shift is seen. There is no evidence of fracture; visualized osseous structures are unremarkable in appearance. The orbits are within normal limits. The paranasal sinuses and mastoid air cells are well-aerated. No significant soft tissue abnormalities are seen. IMPRESSION: Unremarkable noncontrast CT of the head. Electronically Signed   By: Garald Balding M.D.   On: 02/05/2015 23:40   Ct Abdomen Pelvis W Contrast  02/05/2015  CLINICAL DATA:  Right abdomen pain for 3 weeks EXAM: CT ABDOMEN AND PELVIS WITH CONTRAST TECHNIQUE: Multidetector CT imaging of the abdomen and pelvis was performed using the standard protocol following bolus administration of intravenous contrast. CONTRAST:  84mL OMNIPAQUE IOHEXOL 300 MG/ML SOLN, 126mL OMNIPAQUE IOHEXOL 300 MG/ML SOLN COMPARISON:  None. FINDINGS: The liver, spleen, pancreas, gallbladder, adrenal glands are normal. There are bilateral kidney cysts, largest in the upper pole left kidney measuring 4.2 cm. There is no hydronephrosis bilaterally. The aorta is normal. There is no abdominal lymphadenopathy. There is no small bowel obstruction or diverticulitis. The  appendix is normal. The bladder is decompressed limiting evaluation. The uterus is normal. Minimal free fluid is identified in the pelvis, likely physiologic. There is a minimal dependent atelectasis of the posterior lung bases. Degenerative joint changes of the spine are noted. IMPRESSION: No acute abnormality identified in the abdomen and pelvis. The appendix is normal. There is no bowel obstruction. Minimal free fluid in the pelvis, likely physiologic. Electronically Signed   By: Abelardo Diesel M.D.   On: 02/05/2015 07:48   I have personally reviewed and evaluated these images as part of my medical decision-making.   EKG Interpretation None      MDM   Final diagnoses:  None    Patient presents here with complaints of severe headache that started while at work this evening. She has not tried anything for her headache. She was here this morning with severe abdominal pain. She underwent a CT scan and laboratory studies which were unremarkable.  On exam the neurologic exam is nonfocal. Due to the level of the reported discomfort, she did undergo a head CT which was  unremarkable. She was also given a migraine cocktail and fluids in the ER and does report feeling better. At this point I believe she is appropriate for discharge. To return as needed for any problems.  I personally performed the services described in this documentation, which was scribed in my presence. The recorded information has been reviewed and is accurate.      Shelby Speak, MD 02/06/15 [phone]

## 2015-02-06 NOTE — Discharge Instructions (Signed)
Ibuprofen 600 mg every 6 hours as needed for pain.  Drink plenty of fluids and get plenty of rest.  Return to the emergency department if symptoms significantly worsen or change.   General Headache Without Cause A headache is pain or discomfort felt around the head or neck area. The specific cause of a headache may not be found. There are many causes and types of headaches. A few common ones are:  Tension headaches.  Migraine headaches.  Cluster headaches.  Chronic daily headaches. HOME CARE INSTRUCTIONS  Watch your condition for any changes. Take these steps to help with your condition: Managing Pain  Take over-the-counter and prescription medicines only as told by your health care provider.  Lie down in a dark, quiet room when you have a headache.  If directed, apply ice to the head and neck area:  Put ice in a plastic bag.  Place a towel between your skin and the bag.  Leave the ice on for 20 minutes, 2-3 times per day.  Use a heating pad or hot shower to apply heat to the head and neck area as told by your health care provider.  Keep lights dim if bright lights bother you or make your headaches worse. Eating and Drinking  Eat meals on a regular schedule.  Limit alcohol use.  Decrease the amount of caffeine you drink, or stop drinking caffeine. General Instructions  Keep all follow-up visits as told by your health care provider. This is important.  Keep a headache journal to help find out what may trigger your headaches. For example, write down:  What you eat and drink.  How much sleep you get.  Any change to your diet or medicines.  Try massage or other relaxation techniques.  Limit stress.  Sit up straight, and do not tense your muscles.  Do not use tobacco products, including cigarettes, chewing tobacco, or e-cigarettes. If you need help quitting, ask your health care provider.  Exercise regularly as told by your health care provider.  Sleep on a  regular schedule. Get 7-9 hours of sleep, or the amount recommended by your health care provider. SEEK MEDICAL CARE IF:   Your symptoms are not helped by medicine.  You have a headache that is different from the usual headache.  You have nausea or you vomit.  You have a fever. SEEK IMMEDIATE MEDICAL CARE IF:   Your headache becomes severe.  You have repeated vomiting.  You have a stiff neck.  You have a loss of vision.  You have problems with speech.  You have pain in the eye or ear.  You have muscular weakness or loss of muscle control.  You lose your balance or have trouble walking.  You feel faint or pass out.  You have confusion.   This information is not intended to replace advice given to you by your health care provider. Make sure you discuss any questions you have with your health care provider.   Document Released: 01/27/2005 Document Revised: 10/18/2014 Document Reviewed: 05/22/2014 Elsevier Interactive Patient Education [employer].

## 2015-02-16 ENCOUNTER — Ambulatory Visit
Admission: RE | Admit: 2015-02-16 | Discharge: 2015-02-16 | Disposition: A | Discharge status: Home / Self Care | Payer: No Typology Code available for payment source | Source: Ambulatory Visit | Attending: Family Medicine | Admitting: Family Medicine

## 2015-02-16 DIAGNOSIS — M5412 Radiculopathy, cervical region: Secondary | ICD-10-CM

## 2015-02-16 MED ORDER — IOHEXOL 300 MG/ML  SOLN
1.0000 mL | Freq: Once | INTRAMUSCULAR | Status: AC | PRN
  Administered 2015-02-16: 1 mL via EPIDURAL

## 2015-02-16 MED ORDER — TRIAMCINOLONE ACETONIDE 40 MG/ML IJ SUSP (RADIOLOGY)
60.0000 mg | Freq: Once | INTRAMUSCULAR | Status: AC
  Administered 2015-02-16: 60 mg via EPIDURAL

## 2015-03-09 ENCOUNTER — Other Ambulatory Visit: Payer: No Typology Code available for payment source

## 2015-03-13 ENCOUNTER — Ambulatory Visit (INDEPENDENT_AMBULATORY_CARE_PROVIDER_SITE_OTHER): Payer: Worker's Compensation | Admitting: Family Medicine

## 2015-03-13 ENCOUNTER — Encounter: Payer: Self-pay | Admitting: Family Medicine

## 2015-03-13 VITALS — BP 103/72 | HR 89 | Ht 67.0 in | Wt 150.0 lb

## 2015-03-13 DIAGNOSIS — M25561 Pain in right knee: Secondary | ICD-10-CM

## 2015-03-13 DIAGNOSIS — M25562 Pain in left knee: Secondary | ICD-10-CM

## 2015-03-13 MED ORDER — HYDROCODONE-ACETAMINOPHEN 5-325 MG PO TABS: 1.0000 | ORAL_TABLET | Freq: Four times a day (QID) | ORAL | Status: DC | PRN

## 2015-03-13 MED ORDER — MELOXICAM 15 MG PO TABS: 15.0000 mg | ORAL_TABLET | Freq: Every day | ORAL | Status: DC

## 2015-03-13 NOTE — Patient Instructions (Addendum)
You have a knee contusion. Do home exercises as directed - knee extensions, straight leg raises 3 sets of 10 once a day. Add ankle weights if these become too easy. Icing 15 minutes at a time 3-4 times a day. Knee sleeve or brace for support. Meloxicam 15mg  daily with food for pain and inflammation. Norco as needed for severe pain. Follow up with me in 4 weeks for reevaluation. Consider MRI, physical therapy if not improving.

## 2015-03-15 ENCOUNTER — Encounter (HOSPITAL_BASED_OUTPATIENT_CLINIC_OR_DEPARTMENT_OTHER): Payer: Self-pay | Admitting: Emergency Medicine

## 2015-03-15 ENCOUNTER — Emergency Department (HOSPITAL_BASED_OUTPATIENT_CLINIC_OR_DEPARTMENT_OTHER)
Admission: EM | Admit: 2015-03-15 | Discharge: 2015-03-16 | Disposition: A | Discharge status: Home / Self Care | Payer: BLUE CROSS/BLUE SHIELD | Attending: Emergency Medicine | Admitting: Emergency Medicine

## 2015-03-15 ENCOUNTER — Emergency Department (HOSPITAL_BASED_OUTPATIENT_CLINIC_OR_DEPARTMENT_OTHER): Payer: BLUE CROSS/BLUE SHIELD

## 2015-03-15 DIAGNOSIS — Y9289 Other specified places as the place of occurrence of the external cause: Secondary | ICD-10-CM | POA: Insufficient documentation

## 2015-03-15 DIAGNOSIS — G43909 Migraine, unspecified, not intractable, without status migrainosus: Secondary | ICD-10-CM | POA: Insufficient documentation

## 2015-03-15 DIAGNOSIS — Y990 Civilian activity done for income or pay: Secondary | ICD-10-CM | POA: Diagnosis not present

## 2015-03-15 DIAGNOSIS — W1849XA Other slipping, tripping and stumbling without falling, initial encounter: Secondary | ICD-10-CM | POA: Diagnosis not present

## 2015-03-15 DIAGNOSIS — Y9389 Activity, other specified: Secondary | ICD-10-CM | POA: Diagnosis not present

## 2015-03-15 DIAGNOSIS — F17210 Nicotine dependence, cigarettes, uncomplicated: Secondary | ICD-10-CM | POA: Diagnosis not present

## 2015-03-15 DIAGNOSIS — M25562 Pain in left knee: Principal | ICD-10-CM

## 2015-03-15 DIAGNOSIS — Z8739 Personal history of other diseases of the musculoskeletal system and connective tissue: Secondary | ICD-10-CM | POA: Diagnosis not present

## 2015-03-15 DIAGNOSIS — M23203 Derangement of unspecified medial meniscus due to old tear or injury, right knee: Secondary | ICD-10-CM | POA: Insufficient documentation

## 2015-03-15 DIAGNOSIS — Z79899 Other long term (current) drug therapy: Secondary | ICD-10-CM | POA: Insufficient documentation

## 2015-03-15 DIAGNOSIS — T148XXA Other injury of unspecified body region, initial encounter: Secondary | ICD-10-CM

## 2015-03-15 DIAGNOSIS — T148 Other injury of unspecified body region: Secondary | ICD-10-CM | POA: Insufficient documentation

## 2015-03-15 DIAGNOSIS — M25561 Pain in right knee: Principal | ICD-10-CM

## 2015-03-15 DIAGNOSIS — Z791 Long term (current) use of non-steroidal anti-inflammatories (NSAID): Secondary | ICD-10-CM | POA: Diagnosis not present

## 2015-03-15 DIAGNOSIS — S59901A Unspecified injury of right elbow, initial encounter: Secondary | ICD-10-CM | POA: Diagnosis present

## 2015-03-15 MED ORDER — METHOCARBAMOL 500 MG PO TABS
1000.0000 mg | ORAL_TABLET | Freq: Once | ORAL | Status: AC
  Administered 2015-03-15: 1000 mg via ORAL
  Filled 2015-03-15: qty 2

## 2015-03-15 MED ORDER — METHOCARBAMOL 500 MG PO TABS: 500.0000 mg | ORAL_TABLET | Freq: Two times a day (BID) | ORAL | Status: DC

## 2015-03-15 NOTE — Progress Notes (Signed)
PCP: PALMER, CHARLES  Subjective:   HPI: Patient is a 45 y.o. female here for bilateral knee injury.  Patient reports she was at work on 1/15 when she tripped over a cord, fell directly on both knees. Initial pain and then developed swelling. Some popping, cramp sensation behind left knee. Pain level 6/10, sharp. Tried a brace but this wasn't staying on. Radiographs left knee were negative. Not taking any medicines for this. No skin changes, fever, other complaints.  Past Medical History  Diagnosis Date  . Back pain   . Vertigo   . Migraine     Current Outpatient Prescriptions on File Prior to Visit  Medication Sig Dispense Refill  . amitriptyline (ELAVIL) 10 MG tablet Take 10 mg by mouth at bedtime.    . meclizine (ANTIVERT) 25 MG tablet Take 25 mg by mouth 3 (three) times daily as needed for dizziness.    . SUMAtriptan (IMITREX) 50 MG tablet   2  . temazepam (RESTORIL) 22.5 MG capsule   5   No current facility-administered medications on file prior to visit.    Past Surgical History  Procedure Laterality Date  . Tubal ligation    . Cesarean section    . Ablation      Allergies  Allergen Reactions  . Augmentin [Amoxicillin-Pot Clavulanate] Hives  . Keflex [Cephalexin] Hives and Other (See Comments)    Also respiratory depression  . Naproxen Hives  . Zithromax [Azithromycin] Hives  . Morphine And Related     Social History   Social History  . Marital Status: Legally Separated    Spouse Name: N/A  . Number of Children: N/A  . Years of Education: N/A   Occupational History  . Not on file.   Social History Main Topics  . Smoking status: Current Every Day Smoker -- 0.50 packs/day for 27 years    Types: Cigarettes  . Smokeless tobacco: Never Used  . Alcohol Use: 0.6 oz/week    1 Glasses of wine per week     Comment: occasions   . Drug Use: No  . Sexual Activity: Yes    Birth Control/ Protection: Surgical   Other Topics Concern  . Not on file    Social History Narrative    No family history on file.  BP 103/72 mmHg  Pulse 89  Ht 5\' 7"  (1.702 m)  Wt 150 lb (68.04 kg)  BMI 23.49 kg/m2  Review of Systems: See HPI above.    Objective:  Physical Exam:  Gen: NAD  Right knee: No gross deformity, ecchymoses, effusion. TTP patellar tendon and anterior patella FROM. Negative ant/post drawers. Negative valgus/varus testing. Negative lachmanns. Negative mcmurrays, apleys, patellar apprehension. NV intact distally.  Left knee: No gross deformity, ecchymoses, swelling. TTP patellar tendon and anterior patella FROM. Negative ant/post drawers. Negative valgus/varus testing. Negative lachmanns. Negative mcmurrays, apleys, patellar apprehension. NV intact distally.    Assessment & Plan:  1. Bilateral knee pain - mechanism and exam reassuring this is a simple knee contusion bilaterally.  Radiographs of left knee negative for fracture.  Icing, meloxicam with norco as needed.  Shown home exercises to do daily.  F/u in 4 weeks.  Consider MRI, physical therapy if not improving.

## 2015-03-15 NOTE — Assessment & Plan Note (Signed)
mechanism and exam reassuring this is a simple knee contusion bilaterally.  Radiographs of left knee negative for fracture.  Icing, meloxicam with norco as needed.  Shown home exercises to do daily.  F/u in 4 weeks.  Consider MRI, physical therapy if not improving.

## 2015-03-15 NOTE — ED Notes (Signed)
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

## 2015-03-15 NOTE — ED Provider Notes (Signed)
CSN: [id number]     Arrival date & time 03/15/15  2314 History  By signing my name below, I, Soijett Blue, attest that this documentation has been prepared under the direction and in the presence of Safiyya Stokes, MD. Electronically Signed: Soijett Blue, ED Scribe. 03/15/2015. 11:45 PM.  Chief Complaint  Patient presents with  . Arm Pain     Patient is a 45 y.o. female presenting with arm pain. The history is provided by the patient. No language interpreter was used.  Arm Pain This is a new problem. The current episode started 3 to 5 hours ago. The problem occurs rarely. The problem has not changed since onset.The symptoms are aggravated by bending. Nothing relieves the symptoms. She has tried nothing for the symptoms. The treatment provided no relief.    Shelby Moreno is a 45 y.o. female who presents to the Emergency Department complaining of right elbow pain onset 8:55 PM. 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 She reports that she twisted her right elbow while attempting to assist the pt. She notes that she has not tried any medications for the relief of her symptoms. She denies color change, wound, rash, and any other symptoms.    Per pt chart review: Pt was seen by her PCP on 03/13/2015 for bilateral knee pain after tripping over a cord at work and landing on her bilateral knees. Pt had bilateral knee xrays that returned negative for fracture. Pt was Rx mobic, flexeril, and norco.   Past Medical History  Diagnosis Date  . Back pain   . Vertigo   . Migraine    Past Surgical History  Procedure Laterality Date  . Tubal ligation    . Cesarean section    . Ablation     History reviewed. No pertinent family history. Social History  Substance Use Topics  . Smoking status: Current Every Day Smoker -- 0.50 packs/day for 27 years    Types: Cigarettes  . Smokeless tobacco: Never Used  . Alcohol Use: 0.6 oz/week    1 Glasses of  wine per week     Comment: occasions    OB History    No data available     Review of Systems  Musculoskeletal: Positive for arthralgias. Negative for joint swelling.  Skin: Negative for color change, rash and wound.  All other systems reviewed and are negative.     Allergies  Augmentin; Keflex; Naproxen; Zithromax; and Morphine and related  Home Medications   Prior to Admission medications   Medication Sig Start Date End Date Taking? Authorizing Provider  amitriptyline (ELAVIL) 10 MG tablet Take 10 mg by mouth at bedtime.    Historical Provider, MD  HYDROcodone-acetaminophen (NORCO/VICODIN) 5-325 MG tablet Take 1 tablet by mouth every 6 (six) hours as needed. 03/13/15   Dene Gentry, MD  meclizine (ANTIVERT) 25 MG tablet Take 25 mg by mouth 3 (three) times daily as needed for dizziness.    Historical Provider, MD  meloxicam (MOBIC) 15 MG tablet Take 1 tablet (15 mg total) by mouth daily. 03/13/15   Dene Gentry, MD  SUMAtriptan (IMITREX) 50 MG tablet  07/17/14   Historical Provider, MD  temazepam (RESTORIL) 22.5 MG capsule  06/04/14   Historical Provider, MD  tinidazole (TINDAMAX) 500 MG tablet TK 2 TS PO D 03/06/15   Historical Provider, MD   BP 109/80 mmHg  Pulse 88  Temp(Src) 98.2 F (36.8  C) (Oral)  Resp 18  Ht 5\' 7"  (1.702 m)  Wt 150 lb (68.04 kg)  BMI 23.49 kg/m2  SpO2 100%  LMP 03/13/2015 Physical Exam  Constitutional: She is oriented to person, place, and time. She appears well-developed and well-nourished. No distress.  HENT:  Head: Normocephalic and atraumatic.  Mouth/Throat: Uvula is midline, oropharynx is clear and moist and mucous membranes are normal.  Eyes: EOM are normal. Pupils are equal, round, and reactive to light.  Neck: Normal range of motion. Neck supple.  Cardiovascular: Normal rate, regular rhythm and intact distal pulses.   Pulmonary/Chest: Effort normal and breath sounds normal. No respiratory distress. She has no wheezes. She has no rales.   Abdominal: Soft. Bowel sounds are normal. There is no tenderness. There is no rebound and no guarding.  Musculoskeletal: Normal range of motion.       Right shoulder: Normal.       Right elbow: Normal.She exhibits normal range of motion, no swelling, no effusion, no deformity and no laceration. No tenderness found. No radial head, no medial epicondyle, no lateral epicondyle and no olecranon process tenderness noted.       Right wrist: Normal.       Left upper arm: Normal. She exhibits no tenderness, no bony tenderness, no swelling, no edema, no deformity and no laceration.       Left forearm: Normal.       Arms:      Left hand: Normal. Normal sensation noted. Normal strength noted.  Intact pronation and supination.  Right hand NVI.  No snuff box tenderness. Intact sensation to all nerve distributions.  5/5 RUE strength.  Negative NEERs test of the right shoulder  Neurological: She is alert and oriented to person, place, and time. She displays abnormal reflex. She exhibits normal muscle tone.  DTRs right upper/lower and left upper/lower intact.  Skin: Skin is warm and dry.  Psychiatric: She has a normal mood and affect. Her behavior is normal.  Nursing note and vitals reviewed.   ED Course  Procedures (including critical care time) DIAGNOSTIC STUDIES: Oxygen Saturation is 100% on RA, nl by my interpretation.    COORDINATION OF CARE: 11:42 PM Discussed treatment plan with pt at bedside which includes right elbow xray and pt agreed to plan.    Labs Review Labs Reviewed - No data to display  Imaging Review Dg Elbow Complete Right  03/15/2015  CLINICAL DATA:  Right elbow pain after moving patient 1 hour ago. Initial encounter. EXAM: RIGHT ELBOW - COMPLETE 3+ VIEW COMPARISON:  None. FINDINGS: There is no evidence of fracture, dislocation, or joint effusion. There is no evidence of arthropathy or other focal bone abnormality. Soft tissues are unremarkable. IMPRESSION: Normal right elbow.  Electronically Signed   By: Monte Fantasia M.D.   On: 03/15/2015 23:47   I have personally reviewed and evaluated these images as part of my medical decision-making.   EKG Interpretation None      MDM   Final diagnoses:  None   Muscle strain Patient already on Norco and mobic, will add ice therapy and robaxin.  Exam and XRAY are both normal.   I personally performed the services described in this documentation, which was scribed in my presence. The recorded information has been reviewed and is accurate.     Veatrice Kells, MD 03/16/15 [id number]

## 2015-04-17 ENCOUNTER — Other Ambulatory Visit: Payer: Self-pay | Admitting: Family Medicine

## 2015-04-17 ENCOUNTER — Telehealth: Payer: Self-pay | Admitting: Family Medicine

## 2015-04-17 DIAGNOSIS — M542 Cervicalgia: Principal | ICD-10-CM

## 2015-04-17 DIAGNOSIS — G8929 Other chronic pain: Secondary | ICD-10-CM

## 2015-04-17 NOTE — Telephone Encounter (Signed)
If she's dealing with the same issue she can get an ESI at right C7-T1.

## 2015-04-17 NOTE — Telephone Encounter (Signed)
Spoke to patient and will set up for ESI at right C7-T1

## 2015-04-24 ENCOUNTER — Ambulatory Visit (INDEPENDENT_AMBULATORY_CARE_PROVIDER_SITE_OTHER): Payer: BLUE CROSS/BLUE SHIELD | Admitting: Family Medicine

## 2015-04-24 ENCOUNTER — Encounter: Payer: Self-pay | Admitting: Family Medicine

## 2015-04-24 VITALS — BP 124/85 | HR 78 | Ht 66.0 in | Wt 151.0 lb

## 2015-04-24 DIAGNOSIS — M25562 Pain in left knee: Secondary | ICD-10-CM

## 2015-04-24 DIAGNOSIS — M25561 Pain in right knee: Secondary | ICD-10-CM

## 2015-04-24 NOTE — Patient Instructions (Signed)
We will go ahead with an MRI of your left knee to assess for a meniscus tear.

## 2015-04-26 NOTE — Progress Notes (Signed)
PCP: PALMER, CHARLES  Subjective:   HPI: Patient is a 45 y.o. female here for bilateral knee injury.  1/31: Patient reports she was at work on 1/15 when she tripped over a cord, fell directly on both knees. Initial pain and then developed swelling. Some popping, cramp sensation behind left knee. Pain level 6/10, sharp. Tried a brace but this wasn't staying on. Radiographs left knee were negative. Not taking any medicines for this. No skin changes, fever, other complaints.  3/14: Patient reports her right knee has improved but left knee still very painful. Pain level 7/10, sharp. Feels like this is going to give out with walking or squatting. + popping. Slight swelling. Wearing a brace. No skin changes, fever, other complaints.  Past Medical History  Diagnosis Date  . Back pain   . Vertigo   . Migraine     Current Outpatient Prescriptions on File Prior to Visit  Medication Sig Dispense Refill  . amitriptyline (ELAVIL) 10 MG tablet Take 10 mg by mouth at bedtime.    Marland Kitchen HYDROcodone-acetaminophen (NORCO/VICODIN) 5-325 MG tablet Take 1 tablet by mouth every 6 (six) hours as needed. 20 tablet 0  . meclizine (ANTIVERT) 25 MG tablet Take 25 mg by mouth 3 (three) times daily as needed for dizziness.    . meloxicam (MOBIC) 15 MG tablet Take 1 tablet (15 mg total) by mouth daily. 30 tablet 2  . methocarbamol (ROBAXIN) 500 MG tablet Take 1 tablet (500 mg total) by mouth 2 (two) times daily. 20 tablet 0  . SUMAtriptan (IMITREX) 50 MG tablet   2  . temazepam (RESTORIL) 22.5 MG capsule   5  . tinidazole (TINDAMAX) 500 MG tablet TK 2 TS PO D  1   No current facility-administered medications on file prior to visit.    Past Surgical History  Procedure Laterality Date  . Tubal ligation    . Cesarean section    . Ablation      Allergies  Allergen Reactions  . Augmentin [Amoxicillin-Pot Clavulanate] Hives  . Keflex [Cephalexin] Hives and Other (See Comments)    Also respiratory  depression  . Naproxen Hives  . Zithromax [Azithromycin] Hives  . Morphine And Related     Social History   Social History  . Marital Status: Legally Separated    Spouse Name: N/A  . Number of Children: N/A  . Years of Education: N/A   Occupational History  . Not on file.   Social History Main Topics  . Smoking status: Current Every Day Smoker -- 0.50 packs/day for 27 years    Types: Cigarettes  . Smokeless tobacco: Never Used  . Alcohol Use: 0.6 oz/week    1 Glasses of wine per week     Comment: occasions   . Drug Use: No  . Sexual Activity: Yes    Birth Control/ Protection: Surgical   Other Topics Concern  . Not on file   Social History Narrative    No family history on file.  BP 124/85 mmHg  Pulse 78  Ht 5\' 6"  (1.676 m)  Wt 151 lb (68.493 kg)  BMI 24.38 kg/m2  Review of Systems: See HPI above.    Objective:  Physical Exam:  Gen: NAD, comfortable in exam room.  Left knee: No gross deformity, ecchymoses, swelling. TTP medial joint line, patellar tendon and anterior patella FROM. Negative ant/post drawers. Negative valgus/varus testing. Negative lachmanns. Pain with mcmurrays, negative apleys.  Negative patellar apprehension. NV intact distally.    Assessment &  Plan:  1. Bilateral knee pain - right knee pain has resolved.  Left knee still bothering her at a high level despite icing, meloxicam, norco.  Will go ahead with MRI to assess for meniscus tear.

## 2015-04-26 NOTE — Assessment & Plan Note (Signed)
right knee pain has resolved.  Left knee still bothering her at a high level despite icing, meloxicam, norco.  Will go ahead with MRI to assess for meniscus tear.

## 2015-04-27 ENCOUNTER — Ambulatory Visit
Admission: RE | Admit: 2015-04-27 | Discharge: 2015-04-27 | Disposition: A | Discharge status: Home / Self Care | Payer: BLUE CROSS/BLUE SHIELD | Source: Ambulatory Visit | Attending: Family Medicine | Admitting: Family Medicine

## 2015-04-27 DIAGNOSIS — M542 Cervicalgia: Principal | ICD-10-CM

## 2015-04-27 DIAGNOSIS — G8929 Other chronic pain: Secondary | ICD-10-CM

## 2015-04-27 MED ORDER — IOHEXOL 300 MG/ML  SOLN
1.0000 mL | Freq: Once | INTRAMUSCULAR | Status: AC | PRN
  Administered 2015-04-27: 1 mL via EPIDURAL

## 2015-04-27 MED ORDER — TRIAMCINOLONE ACETONIDE 40 MG/ML IJ SUSP (RADIOLOGY)
60.0000 mg | Freq: Once | INTRAMUSCULAR | Status: AC
  Administered 2015-04-27: 60 mg via EPIDURAL

## 2015-04-27 NOTE — Discharge Instructions (Signed)

## 2015-08-21 ENCOUNTER — Ambulatory Visit: Payer: PRIVATE HEALTH INSURANCE | Attending: Family Medicine | Admitting: Occupational Therapy

## 2015-08-21 DIAGNOSIS — M25621 Stiffness of right elbow, not elsewhere classified: Secondary | ICD-10-CM | POA: Insufficient documentation

## 2015-08-21 DIAGNOSIS — M25631 Stiffness of right wrist, not elsewhere classified: Secondary | ICD-10-CM | POA: Insufficient documentation

## 2015-08-21 DIAGNOSIS — M6281 Muscle weakness (generalized): Secondary | ICD-10-CM | POA: Diagnosis present

## 2015-08-21 DIAGNOSIS — M79601 Pain in right arm: Secondary | ICD-10-CM | POA: Diagnosis not present

## 2015-08-21 NOTE — Patient Instructions (Signed)
  Wear wrist splint during aggravating activities (repetitive use, cleaning, and at night).  Remove splint and perform exercises 3-4 times/day.  Edema Reduction (Contrast Baths) 1-2 times/day or use heat (10 min) before exercise and cold (73min) after exercise     Have 2 containers deep enough for involved area to be immersed. Fill one with warm water and the other with slightly chilled water. Soak in warm water for 1 to 2 minutes; cold for  to 1 minute. Alternate and continue for 10 minutes. End in warm water.  Copyright  VHI. All rights reserved.    AROM: Wrist Extension   .  With elbow straight and  palm down, bend wrist up. Repeat __10__ times per set.  Do __3-4__ sessions per day.    AROM: Wrist Flexion   With elbow straight palm up, bend wrist up. Repeat __10__ times per set.  Do _3-4___ sessions per day.   AROM: Forearm Pronation / Supination   With elbow by your side and  arm in handshake position, slowly rotate palm down until stretch is felt. Relax. Then rotate palm up until stretch is felt. Repeat _10___ times per set. Do _3-4___ sessions per day.  Copyright  VHI. All rights reserved.         AROM: Wrist Radial / Ulnar Deviation    Gently bend left wrist from side to side as far as possible. Repeat 10 times per set.  Do 3-4 sessions per day.    AROM: Elbow Flexion / Extension    Gently bend elbow as far as possible. Then straighten arm as far as possible. Repeat _10___ times per set. Do _3-4___ sessions per day.

## 2015-08-21 NOTE — Therapy (Addendum)
[facility] [address] [place], [place], [address] Phone: [phone]   Fax:  [phone]  Occupational Therapy Evaluation  Patient Details  Name: Shelby Moreno MRN: [id number] Date of Birth: 05/05/1970 Referring Provider: Dr. Odis Luster  Encounter Date: 08/21/2015      OT End of Session - 08/21/15 1917    Visit Number 1   Number of Visits 13   Date for OT Re-Evaluation 10/05/15   Authorization Type Worker's Comp approved 8 visits (+eval)   Authorization - Visit Number 1   Authorization - Number of Visits 9   OT Start Time 0850   OT Stop Time 0931   OT Time Calculation (min) 41 min   Activity Tolerance Patient tolerated treatment well   Behavior During Therapy [facility] for tasks assessed/performed      Past Medical History  Diagnosis Date  . Back pain   . Vertigo   . Migraine     Past Surgical History  Procedure Laterality Date  . Tubal ligation    . Cesarean section    . Ablation      There were no vitals filed for this visit.      Subjective Assessment - 08/21/15 0857    Subjective  "I would like to go back to work next week"   Patient Stated Goals go back to work and reduce pain   Currently in Pain? Yes   Pain Score 7   0-7/10   Pain Location --  R elbow, wrist   Pain Orientation Right   Pain Descriptors / Indicators Dull;Throbbing;Tingling   Pain Type Acute pain   Pain Onset 1 to 4 weeks ago   Pain Frequency Intermittent   Aggravating Factors  moving it (supination/pronation, elbow flex/ext)   Pain Relieving Factors rest           [facility] OT Assessment - 08/21/15 0001    Assessment   Diagnosis R arm strain   Referring Provider Dr. Odis Luster   Onset Date 08/08/15   Prior Therapy none   Precautions   Precaution Comments no lifting >10lbs, not working currently   Balance Screen   Has the patient fallen in the past 6 months No   Home  Environment   Family/patient expects to  be discharged to: Private residence   Lives With Son  and niece   Prior Function   Level of Independence Independent   Vocation Full time employment  Cytogeneticist, pt care   ADL   ADL comments Performing BADLs mod I   IADL   Prior Level of Function Shopping only carries light items in LUE, doesn't do much grocery shopping   Prior Level of Function Light Housekeeping independent, pt primarily performed   Light Housekeeping --  family perfroming, pain with vacuuming, cleaning tables   Prior Level of Function Meal Prep pt performs light tasks with mild pain lifting pots/pans  doesn't cook much   Programmer, applications own vehicle   Prior Level of Function Meal Prep uses LUE primarily   Mobility   Mobility Status Independent   Written Expression   Dominant Hand Right   Handwriting --  pain with a lot of writing   Activity Tolerance   Activity Tolerance Comments pt reports that light tasks cause mild pain but incr if pt continues activity   Observation/Other Assessments   Other Surveys  Quick DASH score of 47.5%   Sensation   Additional Comments  pt reports mild occasional tingling in wrist, numbness at night   Coordination   Coordination appears WNL   Edema   Edema none noted   ROM / Strength   AROM / PROM / Strength AROM   AROM   Overall AROM Comments RUE:  supination WNL, pronation 68*, wrist flex WNL, ext 55*, (pain with end range wrist flex, ext, pronation, RD), RD 20*, UD WNL, elbow flex 135* (L 140*), elbow ext WNL   Hand Function   Right Hand Grip (lbs) 65   Left Hand Grip (lbs) 77                  OT Treatments/Exercises (OP) - 08/21/15 0001    Splinting   Splinting Pt fitted for R pre-fab wrist splint for wear during repetitive or heavier activities and at night due to reports of waking up with pain.  Pt verbalized understanding.               OT Education - 08/21/15 1933    Education provided Yes   Education  Details Splint wear/care; Use of Contrast Bath; initial AROM HEP   Person(s) Educated Patient   Methods Explanation;Demonstration;Handout   Comprehension Verbalized understanding;Verbal cues required          OT Short Term Goals - 08/21/15 1926    OT SHORT TERM GOAL #1   Title Pt will be independent with HEP for ROM.--check STGs 09/20/15   Time 4   Period Weeks   Status New   OT SHORT TERM GOAL #2   Title Pt will verbalize understanding of splint wear/care and activity modification to decr pain prn.   Time 4   Period Weeks   Status New   OT SHORT TERM GOAL #3   Title Pt will report pain less than or equal to 4/10 for ADLs/light IADLs.   Time 4   Period Weeks   Status New   OT SHORT TERM GOAL #4   Title Pt will demo at least 60* wrist extension for ADLs/IADLs.   Time 4   Period Weeks   Status New   OT SHORT TERM GOAL #5   Title Pt will demo at least 90* pronation for ADLs/IADLs.   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   Time 6   Period Weeks   Status New   OT LONG TERM GOAL #3   Title Pt will improve pain/RUE functional use as shown by improving Quick Dash to 15% or less.   Time 6   OT LONG TERM GOAL #4   Title Pt will improve R grip strength by at least 10lbs to assist with lifting/return to work.   Time 6   Period Weeks   Status New   OT LONG TERM GOAL #5   Title Pt will be able to lift 45lbs with BUEs in prep for work tasks.   Time 6   Period Weeks   Status New               Plan -  08/21/15 1919    Clinical Impression Statement Pt is a 45 y.o. female with diagnosis of R arm strain.  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  Pt presents with decr ROM, pain, and decr strength resulting in decr dominant RUE functional use.  Pt reports that she is currently not working but would like to retun to work soon.  Pt would benefit from occupational therapy to address pain, ROM, strength, and RUE functional use to improve ADL/IADL performance and abilit to return to work.     Rehab Potential Good   OT Frequency 2x / week   OT Duration 6 weeks  +evaluation   OT Treatment/Interventions Self-care/ADL training;Electrical Stimulation;Cryotherapy;Moist Heat;Contrast Bath;Fluidtherapy;Passive range of motion;Parrafin;DME and/or AE instruction;Therapeutic activities;Therapeutic exercises;Ultrasound;Neuromuscular education;Manual Therapy;Splinting;Therapeutic exercise;Patient/family education   Plan modalities for pain, review AROM HEP and progress/add PROM as appropriate   OT Home Exercise Plan Education provided:  initial AROM HEP, wrist splint wear/care, contrast bath   Recommended Other Services none at this time   Consulted and Agree with Plan of Care Patient      Patient will benefit from skilled therapeutic intervention in order to improve the following deficits and impairments:  Decreased knowledge of use of DME, Impaired UE functional use, Pain, Decreased strength, Decreased activity tolerance, Impaired sensation, Decreased endurance, Decreased range of motion  Visit Diagnosis: Pain In Right Arm  Stiffness of right wrist, not elsewhere classified  Stiffness of right elbow, not elsewhere classified  Muscle weakness (generalized)    Problem List Patient Active Problem List   Diagnosis Date Noted  . Bilateral knee pain 03/15/2015  . Neck pain 01/04/2014  . Low back pain 01/04/2014    [facility] 08/21/2015, 7:36 PM  [facility] [address] [place], [place], [address] Phone: [phone]   Fax:  [phone]  Name: Shelby Moreno MRN:  [id number] Date of Birth: Dec 29, 1970  Vianne Bulls, OTR/L [facility] [address] [place], [place]  [address] [phone] phone [phone] 08/21/2015 7:36 PM

## 2015-08-27 ENCOUNTER — Ambulatory Visit: Payer: PRIVATE HEALTH INSURANCE | Admitting: Occupational Therapy

## 2015-08-27 ENCOUNTER — Encounter: Payer: Self-pay | Admitting: Occupational Therapy

## 2015-08-27 DIAGNOSIS — M25631 Stiffness of right wrist, not elsewhere classified: Secondary | ICD-10-CM

## 2015-08-27 DIAGNOSIS — M6281 Muscle weakness (generalized): Secondary | ICD-10-CM

## 2015-08-27 DIAGNOSIS — M79601 Pain in right arm: Secondary | ICD-10-CM

## 2015-08-27 DIAGNOSIS — M25621 Stiffness of right elbow, not elsewhere classified: Secondary | ICD-10-CM

## 2015-08-27 NOTE — Therapy (Signed)
[facility] [address] [place], [place], [address] Phone: [phone]   Fax:  [phone]  Occupational Therapy Treatment  Patient Details  Name: Shelby Moreno MRN: [id number] Date of Birth: Jun 20, 1970 Referring Provider: Dr. Odis Luster  Encounter Date: 08/27/2015      OT End of Session - 08/27/15 2045    Visit Number 2   Number of Visits 13   Date for OT Re-Evaluation 10/05/15   Authorization Type Worker's Comp approved 8 visits (+ eval)   Authorization - Visit Number 2   Authorization - Number of Visits 9   OT Start Time [id number]   OT Stop Time 1530   OT Time Calculation (min) 43 min   Activity Tolerance Patient tolerated treatment well   Behavior During Therapy [facility] for tasks assessed/performed      Past Medical History  Diagnosis Date  . Back pain   . Vertigo   . Migraine     Past Surgical History  Procedure Laterality Date  . Tubal ligation    . Cesarean section    . Ablation      There were no vitals filed for this visit.      Subjective Assessment - 08/27/15 1454    Subjective  I take Flexeril and Tylenol - when it is hurting it's hurting.   Patient Stated Goals go back to work and reduce pain   Currently in Pain? Yes   Pain Score 5    Pain Location Arm  forearm    Pain Orientation Right   Pain Descriptors / Indicators Throbbing;Aching   Pain Type Acute pain   Pain Onset 1 to 4 weeks ago   Pain Frequency Intermittent   Aggravating Factors  using it   Pain Relieving Factors rest                      OT Treatments/Exercises (OP) - 08/27/15 0001    ADLs   ADL Comments Reviewed short and long term goals with patient.  Reviewed splint schedule.  Patient indicates that she only wears the splint for driving long distances, she does not wear splint at bedtime as she feels this leads to increased discomfort.  Reviewed lifting restriction, and discussed that patient should  refrain from heavy housework, e.g. vaccuuming with her right hand at this time.  Instructed patient, as on evaluation, to wear splint for increased support during repetitive motion tasks.     ADL Education Given Yes   General Comments Reviewed benefits of heat and ice. Patient tried contrast bath, but felt ice increased her symptoms.  SHe feels she gets better relief with heat.    Modalities   Modalities Fluidotherapy   RUE Fluidotherapy   Number Minutes Fluidotherapy 10 Minutes   RUE Fluidotherapy Location Hand;Wrist;Forearm   Comments Patient completed active wrist and forearm exercise while in fluidotherapy.    Manual Therapy   Manual Therapy Myofascial release   Manual therapy comments Myofascial release between radius and ulna - tissue tight, restricted.  Patient only able to tolerate light pressure on either volar or dorsal surface of mid forearm.                  OT Education - 08/27/15 2045    Education provided Yes   Education Details reviewed short/long term goals, splint wearing schedule   Person(s) Educated Patient   Methods Explanation;Demonstration   Comprehension Verbalized understanding  OT Short Term Goals - 08/27/15 2049    OT SHORT TERM GOAL #1   Title Pt will be independent with HEP for ROM.--check STGs 09/20/15   Status On-going   OT SHORT TERM GOAL #2   Title Pt will verbalize understanding of splint wear/care and activity modification to decr pain prn.   Status On-going   OT SHORT TERM GOAL #3   Title Pt will report pain less than or equal to 4/10 for ADLs/light IADLs.   Status On-going   OT SHORT TERM GOAL #4   Title Pt will demo at least 60* wrist extension for ADLs/IADLs.   Status On-going   OT SHORT TERM GOAL #5   Title Pt will demo at least 90* pronation for ADLs/IADLs.   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   Status On-going   OT LONG TERM GOAL #3   Title Pt will improve pain/RUE functional use as shown by improving Quick Dash to 15% or less.   Status On-going   OT LONG TERM GOAL #4   Title Pt will improve R grip strength by at least 10lbs to assist with lifting/return to work.   Status On-going   OT LONG TERM GOAL #5   Title Pt will be able to lift 45lbs with BUEs in prep for work tasks.   Status On-going               Plan - 08/27/15 2046    Clinical Impression Statement Patient is agreeable to OT Plan of care, is completing home exercise program, and is reporting slight decrease in overall pain in right forearm/wrist.  Patient still limited by pain, decreased motion, and strength in dominant right UE.     Rehab Potential Good   OT Frequency 2x / week   OT Duration 6 weeks   OT Treatment/Interventions Self-care/ADL training;Electrical Stimulation;Cryotherapy;Moist Heat;Contrast Bath;Fluidtherapy;Passive range of motion;Parrafin;DME and/or AE instruction;Therapeutic activities;Therapeutic exercises;Ultrasound;Neuromuscular education;Manual Therapy;Splinting;Therapeutic exercise;Patient/family education   Plan Modalities for pain, consider [place] forearm, work within pain tolerance gentle PROM as appropriate   OT Home Exercise Plan Education provided:  initial AROM HEP, wrist splint wear/care, contrast bath   Consulted and Agree with Plan of Care Patient      Patient will benefit from skilled therapeutic intervention in order to improve the following deficits and impairments:  Decreased knowledge of use of DME, Impaired UE functional use, Pain, Decreased strength, Decreased activity tolerance, Impaired sensation, Decreased endurance, Decreased range of motion  Visit Diagnosis: Pain In Right Arm  Stiffness of right wrist, not elsewhere  classified  Stiffness of right elbow, not elsewhere classified  Muscle weakness (generalized)    Problem List Patient Active Problem List   Diagnosis Date Noted  . Bilateral knee pain 03/15/2015  . Neck pain 01/04/2014  . Low back pain 01/04/2014    Mariah Milling, OTR/L 08/27/2015, 8:53 PM  [facility] [address] [place], [place], [address] Phone: [phone]   Fax:  [phone]  Name: Shelby Moreno MRN: [id number] Date of Birth: Dec 12, 1970

## 2015-08-30 ENCOUNTER — Ambulatory Visit: Payer: PRIVATE HEALTH INSURANCE | Attending: Family Medicine | Admitting: Occupational Therapy

## 2015-08-30 ENCOUNTER — Encounter: Payer: Self-pay | Admitting: Occupational Therapy

## 2015-08-30 DIAGNOSIS — M25621 Stiffness of right elbow, not elsewhere classified: Secondary | ICD-10-CM | POA: Insufficient documentation

## 2015-08-30 DIAGNOSIS — M6281 Muscle weakness (generalized): Secondary | ICD-10-CM | POA: Insufficient documentation

## 2015-08-30 DIAGNOSIS — M79601 Pain in right arm: Secondary | ICD-10-CM | POA: Diagnosis not present

## 2015-08-30 DIAGNOSIS — M25631 Stiffness of right wrist, not elsewhere classified: Secondary | ICD-10-CM | POA: Insufficient documentation

## 2015-08-30 NOTE — Therapy (Signed)
[facility] [address] [place], [place], [address] Phone: [phone]   Fax:  [phone]  Occupational Therapy Treatment  Patient Details  Name: Amelia Pinkerman MRN: [id number] Date of Birth: [birth year] Referring Provider: Dr. Odis Luster  Encounter Date: 08/30/2015      OT End of Session - 08/30/15 1555    Visit Number 3   Number of Visits 13   Date for OT Re-Evaluation 10/05/15   Authorization Type Worker's Comp approved 8 visits (+ eval)   Authorization - Visit Number 3   Authorization - Number of Visits 9   OT Start Time [id number]   OT Stop Time 1535   OT Time Calculation (min) 48 min   Activity Tolerance Patient tolerated treatment well   Behavior During Therapy [facility] for tasks assessed/performed      Past Medical History  Diagnosis Date  . Back pain   . Vertigo   . Migraine     Past Surgical History  Procedure Laterality Date  . Tubal ligation    . Cesarean section    . Ablation      There were no vitals filed for this visit.      Subjective Assessment - 08/30/15 1535    Subjective  Tylenol doesn't do anything fo rme.     Patient Stated Goals go back to work and reduce pain   Currently in Pain? Yes   Pain Score 4    Pain Location Arm   Pain Orientation Right   Pain Descriptors / Indicators Aching;Throbbing   Pain Type Acute pain   Pain Onset 1 to 4 weeks ago   Pain Frequency Intermittent   Aggravating Factors  I don't know- It just hurst sometimes when I am not doing anything,                      OT Treatments/Exercises (OP) - 08/30/15 0001    ADLs   Home Maintenance Reviewed with patient not to complete vaccumming with right hand - repetitive and resistive.  Discussed bed making - changing sheets - resistive wrist and forearm motion - patient expressed understanding   General Comments Patient indicates that she is not taking pain medicine - and indicates it isn't helping  her pain.  Discussed the benefits of anti - inflammatory medicines as allowed by MD to reduce inflammation in forearm.     Neurological Re-education Exercises   Other Exercises 1 Used forearm gym to address combined motions of hand, wrist, forearm, elbow - for more fluid movement patterns, and to reduce tension.  Patient needs improved interlimb coordiation.     Moist Heat Therapy   Number Minutes Moist Heat 10 Minutes   Moist Heat Location --  forearm - right   Ultrasound   Ultrasound Location forearm right   Ultrasound Parameters 0.8 w/cm2, 36mHZ, 5 min volar surface, 5 min dorsal surface interoosei between radius and ulna    Ultrasound Goals Pain;Edema   Manual Therapy   Manual Therapy Myofascial release   Manual therapy comments Interossei between radius and ulna - able to tolerate slightly increased pressure throughout long axis                OT Education - 08/30/15 1554    Education provided Yes   Education Details reviewed weight bearing restriction as applied to household tasks, recommended anti inflammatory use as allowed by MD   Person(s) Educated Patient   Methods Explanation;Demonstration   Comprehension  Verbalized understanding          OT Short Term Goals - 08/30/15 1558    OT SHORT TERM GOAL #5   Title Pt will demo at least 90* pronation for ADLs/IADLs.   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   Status On-going   OT LONG TERM GOAL #3   Title Pt will improve pain/RUE functional use as shown by improving Quick Dash to 15% or less.   Status On-going   OT LONG TERM GOAL #4   Title Pt will improve R grip strength by at least 10lbs to assist with lifting/return to work.   Status On-going   OT LONG  TERM GOAL #5   Title Pt will be able to lift 45lbs with BUEs in prep for work tasks.   Status On-going               Plan - 08/30/15 1555    Clinical Impression Statement Patient reports slight decrease in pain - although pain is frequent and interferes with ADL/IADL. Patient with improved mobiliy for forearm pronation this session.  Recommending first pain control, and active motion, and next goal is strengthening.     Rehab Potential Good   OT Frequency 2x / week   OT Duration 6 weeks   OT Treatment/Interventions Self-care/ADL training;Electrical Stimulation;Cryotherapy;Moist Heat;Contrast Bath;Fluidtherapy;Passive range of motion;Parrafin;DME and/or AE instruction;Therapeutic activities;Therapeutic exercises;Ultrasound;Neuromuscular education;Manual Therapy;Splinting;Therapeutic exercise;Patient/family education   Plan Modalities for pain, edema, MFR Right forearm, gentle mobility   OT Home Exercise Plan Education provided:  initial AROM HEP, wrist splint wear/care, contrast bath   Consulted and Agree with Plan of Care Patient      Patient will benefit from skilled therapeutic intervention in order to improve the following deficits and impairments:  Decreased knowledge of use of DME, Impaired UE functional use, Pain, Decreased strength, Decreased activity tolerance, Impaired sensation, Decreased endurance, Decreased range of motion  Visit Diagnosis: Pain In Right Arm  Stiffness of right wrist, not elsewhere classified  Stiffness of right elbow, not elsewhere classified  Muscle weakness (generalized)    Problem List Patient Active Problem List   Diagnosis Date Noted  . Bilateral knee pain 03/15/2015  . Neck pain 01/04/2014  . Low back pain 01/04/2014    Mariah Milling, OTR/L 08/30/2015, 3:59 PM  [facility] [address] [place], [place], [address] Phone: [phone]   Fax:  [phone]  Name:  Brenli Rawdon MRN: [id number] Date of Birth: 02/11/1970

## 2015-09-03 ENCOUNTER — Ambulatory Visit: Payer: Worker's Compensation | Admitting: Occupational Therapy

## 2015-09-03 ENCOUNTER — Ambulatory Visit: Payer: PRIVATE HEALTH INSURANCE | Admitting: Occupational Therapy

## 2015-09-03 DIAGNOSIS — M25631 Stiffness of right wrist, not elsewhere classified: Secondary | ICD-10-CM

## 2015-09-03 DIAGNOSIS — M25621 Stiffness of right elbow, not elsewhere classified: Secondary | ICD-10-CM

## 2015-09-03 DIAGNOSIS — M79601 Pain in right arm: Secondary | ICD-10-CM

## 2015-09-03 DIAGNOSIS — M6281 Muscle weakness (generalized): Secondary | ICD-10-CM

## 2015-09-03 NOTE — Patient Instructions (Addendum)
  1. Keep wearing wrist splint  2.  Massage forearm up/down, across muscles, and in circles (clockwise/counter-clockwise) for 20min.  3.  With palm up, bend elbow and straighten, hold 5sec in each position and perform 10reps.  4.  With thumb up, bend elbow and straighten, hold 5sec in each position and perform 10reps.  5.  With elbow bent, thumb up, bend wrist out and in, hold 5 sec each position and perform 10 reps.  6. With elbow bent, palm up, bend wrist out and in, hold 5 sec each position and perform 10 reps

## 2015-09-03 NOTE — Therapy (Signed)
[facility] [address] [place], [place], [address] Phone: [phone]   Fax:  [phone]  Occupational Therapy Treatment  Patient Details  Name: Shelby Moreno MRN: [id number] Date of Birth: November 20, 1970 Referring Provider: Dr. Lanell Persons  Encounter Date: 09/03/2015      OT End of Session - 09/03/15 1114    Visit Number 4   Number of Visits 13   Date for OT Re-Evaluation 10/05/15   Authorization Type Worker's Comp approved 8 visits (+ eval)   Authorization - Visit Number 4   Authorization - Number of Visits 9   OT Start Time 1110   OT Stop Time 1148   OT Time Calculation (min) 38 min   Activity Tolerance Patient tolerated treatment well;Patient limited by pain  limited by pain at times--needed multiple breaks   Behavior During Therapy [facility] for tasks assessed/performed      Past Medical History:  Diagnosis Date  . Back pain   . Migraine   . Vertigo     Past Surgical History:  Procedure Laterality Date  . ABLATION    . CESAREAN SECTION    . TUBAL LIGATION      There were no vitals filed for this visit.      Subjective Assessment - 09/03/15 1112    Subjective  Pt reports that brace helps some, but continues to report significant pain.  Next MD appt 09/13/15   Patient Stated Goals go back to work and reduce pain   Currently in Pain? Yes   Pain Score 7    Pain Location Arm   Pain Orientation Right   Pain Descriptors / Indicators Aching;Throbbing   Pain Type Acute pain   Pain Onset 1 to 4 weeks ago   Pain Frequency Intermittent   Aggravating Factors  unknown, sometimes at rest   Pain Relieving Factors rest, heat                      OT Treatments/Exercises (OP) - 09/03/15 0001      Exercises   Exercises Wrist;Elbow     Elbow Exercises   Other elbow exercises Elbow flex/ext with forearm in supination and neutral with min v.c.     Wrist Exercises   Wrist Radial Deviation  AROM;Right   Wrist Ulnar Deviation AROM;Right   Other wrist exercises wrist flex/ext with elbow bent and forearm in supination, pronation, and neutral      Moist Heat Therapy   Number Minutes Moist Heat 8 Minutes   Moist Heat Location --  R forearm with no adverse reactions     Ultrasound   Ultrasound Location R forearm   Ultrasound Parameters 0.8wts/cm2, , 50% pulsed, x78min to volar forearm due to tightness and pain with no adverse reactions, but pt did report incr tingling/pain after exercise/ultrasound   Ultrasound Goals Pain;Edema     Manual Therapy   Manual Therapy Soft tissue mobilization   Soft tissue mobilization and myofascial release to volar forearm, pt with sensitivity to touch--pressure as tolerated                OT Education - 09/03/15 1153    Education Details updates to HEP, use of massage   Person(s) Educated Patient   Methods Explanation;Demonstration;Handout;Verbal cues   Comprehension Verbalized understanding;Returned demonstration          OT Short Term Goals - 09/03/15 1116      OT SHORT TERM GOAL #1   Title Pt  will be independent with HEP for ROM.--check STGs 09/20/15   Status On-going     OT SHORT TERM GOAL #2   Title Pt will verbalize understanding of splint wear/care and activity modification to decr pain prn.   Status On-going     OT SHORT TERM GOAL #3   Title Pt will report pain less than or equal to 4/10 for ADLs/light IADLs.   Status On-going     OT SHORT TERM GOAL #4   Title Pt will demo at least 60* wrist extension for ADLs/IADLs.   Status On-going     OT SHORT TERM GOAL #5   Title Pt will demo at least 90* pronation for ADLs/IADLs.   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   Status On-going     OT LONG TERM GOAL #3   Title Pt will improve pain/RUE functional use as shown by improving Quick Dash to 15% or less.   Status On-going     OT LONG TERM GOAL #4   Title Pt will improve R grip strength by at least 10lbs to assist with lifting/return to work.   Status On-going     OT LONG TERM GOAL #5   Title Pt will be able to lift 45lbs with BUEs in prep for work tasks.   Status On-going               Plan - 09/03/15 1114    Clinical Impression Statement Pt reports that pain is improved some when she wears the brace, but that the arm still hurts when she is not doing anything and with AROM.   Rehab Potential Good   OT Frequency 2x / week   OT Duration 6 weeks   OT Treatment/Interventions Self-care/ADL training;Electrical Stimulation;Cryotherapy;Moist Heat;Contrast Bath;Fluidtherapy;Passive range of motion;Parrafin;DME and/or AE instruction;Therapeutic activities;Therapeutic exercises;Ultrasound;Neuromuscular education;Manual Therapy;Splinting;Therapeutic exercise;Patient/family education   Plan modalities for pain, edema, manual therapy   OT Home Exercise Plan Education provided:  initial AROM HEP, wrist splint wear/care, contrast bath   Consulted and Agree with Plan of Care Patient      Patient will benefit from skilled therapeutic intervention in order to improve the following deficits and impairments:  Decreased knowledge of use of DME, Impaired UE functional use, Pain, Decreased strength, Decreased activity tolerance, Impaired sensation, Decreased endurance, Decreased range of motion  Visit Diagnosis: Pain In Right Arm  Stiffness of right wrist, not elsewhere classified  Stiffness of right elbow, not elsewhere classified  Muscle weakness (generalized)    Problem List Patient Active Problem List   Diagnosis Date Noted  . Bilateral knee pain 03/15/2015  . Neck pain 01/04/2014  . Low back pain 01/04/2014     [facility] 09/03/2015, 12:10 PM  [facility] [address] [place], [place], [address] Phone: [phone]   Fax:  [phone]  Name: Shelby Moreno MRN: [id number] Date of Birth: May 29, 1970   Vianne Bulls, OTR/L [facility] [address] [place], [place]  [address] [phone] phone [phone] 09/03/15 12:10 PM

## 2015-09-04 ENCOUNTER — Ambulatory Visit: Payer: PRIVATE HEALTH INSURANCE | Admitting: Occupational Therapy

## 2015-09-04 DIAGNOSIS — M6281 Muscle weakness (generalized): Secondary | ICD-10-CM

## 2015-09-04 DIAGNOSIS — M25631 Stiffness of right wrist, not elsewhere classified: Secondary | ICD-10-CM

## 2015-09-04 DIAGNOSIS — M79601 Pain in right arm: Secondary | ICD-10-CM | POA: Diagnosis not present

## 2015-09-04 DIAGNOSIS — M25621 Stiffness of right elbow, not elsewhere classified: Secondary | ICD-10-CM

## 2015-09-04 NOTE — Therapy (Addendum)
[facility] [address] [place], [place], [address] Phone: [phone]   Fax:  [phone]  Occupational Therapy Treatment  Patient Details  Name: Shelby Moreno MRN: [id number] Date of Birth: 11-23-1970 Referring Provider: Dr. Odis Luster  Encounter Date: 09/04/2015      OT End of Session - 09/04/15 1539    Visit Number 5   Number of Visits 13   Date for OT Re-Evaluation 10/05/15   Authorization Type Worker's Comp approved 8 visits (+ eval)   Authorization - Visit Number 5   Authorization - Number of Visits 9   OT Start Time [id number]   OT Stop Time 1615   OT Time Calculation (min) 40 min   Activity Tolerance Patient limited by pain  at times   Behavior During Therapy [facility] for tasks assessed/performed      Past Medical History:  Diagnosis Date  . Back pain   . Migraine   . Vertigo     Past Surgical History:  Procedure Laterality Date  . ABLATION    . CESAREAN SECTION    . TUBAL LIGATION      There were no vitals filed for this visit.      Subjective Assessment - 09/04/15 1538    Subjective  Pt reports pain yesterday after session (had to take pain medication), but none today at beginning of therapy   Pertinent History next MD appt 09/13/15   Patient Stated Goals go back to work and reduce pain   Currently in Pain? No/denies  incr to 6/10 by end of session   Pain Onset --                      OT Treatments/Exercises (OP) - 09/04/15 0001      Exercises   Exercises Wrist     Elbow Exercises   Other elbow exercises Elbow flex/ext with forearm in supination and neutral with min v.c. x10 each     Wrist Exercises   Other wrist exercises wrist flex/ext with elbow bent and forearm in supination, pronation, and neutral      Moist Heat Therapy   Number Minutes Moist Heat 8 Minutes   Moist Heat Location --  R forearm with no adverse reactions     Ultrasound   Ultrasound Location  right volar forearm   Ultrasound Parameters 0.8wts/cm2, 21mhz, 20% pulsed, x41min with no adverse reactions   Ultrasound Goals Pain;Edema     Manual Therapy   Soft tissue mobilization light soft tissue mobs and myofascial release to volar forearm, pt with sensitivity to touch--pressure as tolerated                OT Education - 09/03/15 1153    Education Details updates to HEP, use of massage   Person(s) Educated Patient   Methods Explanation;Demonstration;Handout;Verbal cues   Comprehension Verbalized understanding;Returned demonstration          OT Short Term Goals - 09/04/15 1540      OT SHORT TERM GOAL #1   Title Pt will be independent with HEP for ROM.--check STGs 09/20/15   Status On-going     OT SHORT TERM GOAL #2   Title Pt will verbalize understanding of splint wear/care and activity modification to decr pain prn.   Status On-going     OT SHORT TERM GOAL #3   Title Pt will report pain less than or equal to 4/10 for ADLs/light IADLs.   Status On-going  OT SHORT TERM GOAL #4   Title Pt will demo at least 60* wrist extension for ADLs/IADLs.   Status On-going     OT SHORT TERM GOAL #5   Title Pt will demo at least 90* pronation for ADLs/IADLs.   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   Status On-going     OT LONG TERM GOAL #3   Title Pt will improve pain/RUE functional use as shown by improving Quick Dash to 15% or less.   Status On-going     OT LONG TERM GOAL #4   Title Pt will improve R grip strength by at least 10lbs to assist with lifting/return to work.   Status On-going     OT LONG TERM GOAL #5   Title Pt will be able to lift 45lbs with BUEs in prep for work tasks.   Status  On-going               Plan - 09/04/15 1539    Rehab Potential Good   OT Frequency 2x / week   OT Duration 6 weeks   OT Treatment/Interventions Self-care/ADL training;Electrical Stimulation;Cryotherapy;Moist Heat;Contrast Bath;Fluidtherapy;Passive range of motion;Parrafin;DME and/or AE instruction;Therapeutic activities;Therapeutic exercises;Ultrasound;Neuromuscular education;Manual Therapy;Splinting;Therapeutic exercise;Patient/family education   Plan modalities for pain, edema, manual therapy   OT Home Exercise Plan Education provided:  initial AROM HEP, wrist splint wear/care, contrast bath   Consulted and Agree with Plan of Care Patient      Patient will benefit from skilled therapeutic intervention in order to improve the following deficits and impairments:  Decreased knowledge of use of DME, Impaired UE functional use, Pain, Decreased strength, Decreased activity tolerance, Impaired sensation, Decreased endurance, Decreased range of motion  Visit Diagnosis: Pain In Right Arm  Stiffness of right wrist, not elsewhere classified  Stiffness of right elbow, not elsewhere classified  Muscle weakness (generalized)    Problem List Patient Active Problem List   Diagnosis Date Noted  . Bilateral knee pain 03/15/2015  . Neck pain 01/04/2014  . Low back pain 01/04/2014    [facility] 09/04/2015, 5:49 PM  [facility] [address] [place], [place], [address] Phone: [phone]   Fax:  [phone]  Name: Shelby Moreno MRN: [id number] Date of Birth: 04-22-1970   Vianne Bulls, OTR/L [facility] [address] [place], [place]  [address] [phone] phone [phone] 09/04/15 5:49 PM

## 2015-09-11 ENCOUNTER — Ambulatory Visit: Payer: PRIVATE HEALTH INSURANCE | Attending: Family Medicine | Admitting: Occupational Therapy

## 2015-09-11 DIAGNOSIS — M25631 Stiffness of right wrist, not elsewhere classified: Secondary | ICD-10-CM | POA: Insufficient documentation

## 2015-09-11 DIAGNOSIS — M6281 Muscle weakness (generalized): Secondary | ICD-10-CM | POA: Diagnosis present

## 2015-09-11 DIAGNOSIS — M25621 Stiffness of right elbow, not elsewhere classified: Secondary | ICD-10-CM | POA: Diagnosis present

## 2015-09-11 DIAGNOSIS — M79601 Pain in right arm: Secondary | ICD-10-CM | POA: Diagnosis not present

## 2015-09-11 NOTE — Therapy (Signed)
[facility] [address] [place], [place], [address] Phone: [phone]   Fax:  [phone]  Occupational Therapy Treatment  Patient Details  Name: Shelby Moreno MRN: [id number] Date of Birth: Sep 16, 1970 Referring Provider: Dr. Odis Luster  Encounter Date: 09/11/2015      OT End of Session - 09/11/15 1323    Visit Number 6   Number of Visits 13   Date for OT Re-Evaluation 10/05/15   Authorization Type Worker's Comp approved 8 visits (+ eval)   Authorization - Visit Number 6   Authorization - Number of Visits 9   OT Start Time 1320   OT Stop Time 1400   OT Time Calculation (min) 40 min   Activity Tolerance Patient limited by pain  at times   Behavior During Therapy [facility] for tasks assessed/performed      Past Medical History:  Diagnosis Date  . Back pain   . Migraine   . Vertigo     Past Surgical History:  Procedure Laterality Date  . ABLATION    . CESAREAN SECTION    . TUBAL LIGATION      There were no vitals filed for this visit.      Subjective Assessment - 09/11/15 1321    Subjective  "The pain was really bad last Thursday, I started to go back to the ER"  "It was the worst pain that I had since the injury"  "I was just riding in the car"   Pertinent History next MD appt 09/13/15   Patient Stated Goals go back to work and reduce pain   Currently in Pain? Yes   Pain Score 5    Pain Location Arm   Pain Orientation Right   Pain Descriptors / Indicators Aching;Throbbing   Pain Type Acute pain   Pain Onset 1 to 4 weeks ago   Pain Frequency Intermittent   Aggravating Factors  unknown, sometimes at rest   Pain Relieving Factors rest, heat                      OT Treatments/Exercises (OP) - 09/11/15 0001      ADLs   ADL Comments Checked STGs and discussed decr progress and continued pain.  Discussed anticipated d/c next week due to decr progress.  Pt in agreement.     Elbow  Exercises   Other elbow exercises Elbow flex/ext with forearm in supination and neutral with min v.c. x10 each     Wrist Exercises   Other wrist exercises wrist flex/ext with elbow bent and forearm in supination, pronation, and neutral      Modalities   Modalities Cryotherapy;Electrical Stimulation     Moist Heat Therapy   Number Minutes Moist Heat 8 Minutes   Moist Heat Location --  R forearm with no adverse reactions during ICF     Cryotherapy   Number Minutes Cryotherapy 5 Minutes   Cryotherapy Location Forearm   Type of Cryotherapy Ice pack  during IFC with no adverse reactions for edema/pain     Electrical Stimulation   Electrical Stimulation Location surrounding R elbow/forearm    Electrical Stimulation Action IFC x62mn, intensity 9.6, beat 80-_0 , carrier 4000 hz, sweep with no adverse reactions while hot pack x858m and then ice pack x5m40m   Electrical Stimulation Parameters (see above)   Electrical Stimulation Goals Pain;Edema     Manual Therapy   Soft tissue mobilization light soft tissue mobs and myofascial release to volar  and dorsal forearm, pt with sensitivity to touch--pressure as tolerated                  OT Short Term Goals - 09/11/15 1358      OT SHORT TERM GOAL #1   Title Pt will be independent with HEP for ROM.--check STGs 09/20/15   Status Achieved  09/11/15:  met with AROM HEP     OT SHORT TERM GOAL #2   Title Pt will verbalize understanding of splint wear/care and activity modification to decr pain prn.   Status Achieved  09/11/15     OT SHORT TERM GOAL #3   Title Pt will report pain less than or equal to 4/10 for ADLs/light IADLs.   Status On-going  09/11/15:  5-10/10 pain reported     OT SHORT TERM GOAL #4   Title Pt will demo at least 60* wrist extension for ADLs/IADLs.   Status On-going  09/11/15:  45*     OT SHORT TERM GOAL #5   Title Pt will demo at least 90* pronation for ADLs/IADLs.   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   Status On-going  09/11/15:  40-50%     OT LONG TERM GOAL #3   Title Pt will improve pain/RUE functional use as shown by improving Quick Dash to 15% or less.   Status On-going     OT LONG TERM GOAL #4   Title Pt will improve R grip strength by at least 10lbs to assist with lifting/return to work.   Status On-going     OT LONG TERM GOAL #5   Title Pt will be able to lift 45lbs with BUEs in prep for work tasks.   Status On-going               Plan - 09/11/15 1332    Clinical Impression Statement Pt reports significant pain last week with unknown cause and reports minimal-no improvement with pain overall.  Pt reports pain with gentle AROM and continues to be hypersensitive to touch.   Pain continues to be primary limiting factor to RUE functional use.  Unable to progress activity or exercise at this time due to pain levels (5-10/10 pain reported).     Rehab Potential Good   OT Frequency 2x / week   OT Duration 6 weeks   OT Treatment/Interventions Self-care/ADL training;Electrical Stimulation;Cryotherapy;Moist Heat;Contrast Bath;Fluidtherapy;Passive range of motion;Parrafin;DME and/or AE instruction;Therapeutic activities;Therapeutic exercises;Ultrasound;Neuromuscular education;Manual Therapy;Splinting;Therapeutic exercise;Patient/family education   Plan modalities for pain/edema, manual therapy, AROM; Anticipate hold/or discharge from OT next week due to lack of progress with pain and inability to progress activity.  OT will route 09/11/15 note to MD in prep for appt Thursday   OT Home Exercise Plan Education provided:  initial AROM HEP, wrist splint  wear/care, contrast bath   Consulted and Agree with Plan of Care Patient      Patient will benefit from skilled therapeutic intervention in order to improve the following deficits and impairments:  Decreased knowledge of use of DME, Impaired UE  functional use, Pain, Decreased strength, Decreased activity tolerance, Impaired sensation, Decreased endurance, Decreased range of motion  Visit Diagnosis: Pain In Right Arm  Stiffness of right wrist, not elsewhere classified  Stiffness of right elbow, not elsewhere classified  Muscle weakness (generalized)    Problem List Patient Active Problem List   Diagnosis Date Noted  . Bilateral knee pain 03/15/2015  . Neck pain 01/04/2014  . Low back pain 01/04/2014    [facility] 09/11/2015, 2:31 PM  [facility] [address] [place], [place], [address] Phone: [phone]   Fax:  [phone]  Name: Elverta Dimiceli MRN: [id number] Date of Birth: 1970-08-31  Vianne Bulls, OTR/L [facility] [address] [place], [place]  [address] [phone] phone [phone] 09/11/15 2:31 PM

## 2015-09-13 ENCOUNTER — Ambulatory Visit: Payer: PRIVATE HEALTH INSURANCE | Attending: Family Medicine | Admitting: Occupational Therapy

## 2015-09-13 ENCOUNTER — Encounter: Payer: Self-pay | Admitting: Occupational Therapy

## 2015-09-13 DIAGNOSIS — M25631 Stiffness of right wrist, not elsewhere classified: Secondary | ICD-10-CM | POA: Diagnosis present

## 2015-09-13 DIAGNOSIS — M6281 Muscle weakness (generalized): Secondary | ICD-10-CM | POA: Insufficient documentation

## 2015-09-13 DIAGNOSIS — M25621 Stiffness of right elbow, not elsewhere classified: Secondary | ICD-10-CM | POA: Diagnosis present

## 2015-09-13 DIAGNOSIS — M79601 Pain in right arm: Secondary | ICD-10-CM | POA: Insufficient documentation

## 2015-09-13 NOTE — Therapy (Signed)
[facility] [address] [place], [place], [address] Phone: [phone]   Fax:  [phone]  Occupational Therapy Treatment  Patient Details  Name: Shelby Moreno MRN: [id number] Date of Birth: July 27, 1970 Referring Provider: Dr. Odis Luster  Encounter Date: 09/13/2015      OT End of Session - 09/13/15 1058    Visit Number 7   Number of Visits 13   Date for OT Re-Evaluation 10/05/15   Authorization Type Worker's Comp approved 8 visits (+ eval)   Authorization - Visit Number 7   Authorization - Number of Visits 9   OT Start Time 1015   OT Stop Time 1049   OT Time Calculation (min) 34 min   Equipment Utilized During Treatment moist heat - right forearm   Activity Tolerance Patient tolerated treatment well  Pain reduced from 4-2 after heat   Behavior During Therapy [facility] for tasks assessed/performed      Past Medical History:  Diagnosis Date  . Back pain   . Migraine   . Vertigo     Past Surgical History:  Procedure Laterality Date  . ABLATION    . CESAREAN SECTION    . TUBAL LIGATION      There were no vitals filed for this visit.      Subjective Assessment - 09/13/15 1027    Subjective  Patient had a doctor's appointment follow up this morning, she indicated that doctor said she was "done with this" and needed to be referred to a surgeopn.     Patient Stated Goals go back to work and reduce pain   Currently in Pain? Yes   Pain Score 4    Pain Location Arm   Pain Orientation Right   Pain Descriptors / Indicators Aching;Burning   Pain Type Acute pain   Pain Onset More than a month ago   Pain Frequency Intermittent   Aggravating Factors  unknown- aggravated by action, but also aggravated at times during rest periods.     Pain Relieving Factors heat                      OT Treatments/Exercises (OP) - 09/13/15 0001      ADLs   Overall ADLs Patient reports significantly reduced pain  for basic self care skills - hair care, oral care, pulling up/down clothing, etc.  Patient has had less success with IADL - washing dishes, vaccuuming, putting linens on bed, etc - these activities specifically have significantly set off pain response.     Home Maintenance Discussed vaccuuming. Patient has an upright vaccuum, and she has attempted to push with right UE.  Discussed that this may be more than 10 lbs against resistance of carpeting, and weight of machine, in addition, this is a repetitive motion task, and is likely to increase symptoms at this time.  Reviewed with patient that the intent of therapy has been to reduce pain and inflammation, restore more typical range of motion to her dominant right extremity - and only then would we address strength training.     Work Patient indicates that she has been cleared to return to work with a 10 lb lifting restriction.  Patient is to follow up with a "surgeon" per her report.       Moist Heat Therapy   Number Minutes Moist Heat 10 Minutes   Moist Heat Location Elbow  forearm- right  OT Education - 09/13/15 1056    Education provided Yes   Education Details importance of follow up with MD for persistent burning, tingling, range of motion limitations to dominant right arm.  Inflammation reduction, and pain control techniques, review splint wearing schedule   Person(s) Educated Patient   Methods Explanation;Demonstration   Comprehension Verbalized understanding          OT Short Term Goals - 09/13/15 1032      OT SHORT TERM GOAL #3   Title Pt will report pain less than or equal to 4/10 for ADLs/light IADLs.   Status Partially Met  Better poain control with self care, and some difficulty with household tasks     OT SHORT TERM GOAL #4   Title Pt will demo at least 60* wrist extension for ADLs/IADLs.   Status Not Met     OT SHORT TERM GOAL #5   Title Pt will demo at least 90* pronation for ADLs/IADLs.   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   Status Partially Met  patient using right UE about 60% time     OT LONG TERM GOAL #3   Title Pt will improve pain/RUE functional use as shown by improving Quick Dash to 15% or less.   Status Unable to assess     OT LONG TERM GOAL #4   Title Pt will improve R grip strength by at least 10lbs to assist with lifting/return to work.   Status Not Met  Patient with grip strength 65 lbs on eval, currently 40 lbs.       OT LONG TERM GOAL #5   Title Pt will be able to lift 45lbs with BUEs in prep for work tasks.   Status Unable to assess               Plan - 09/13/15 1059    Clinical Impression Statement Patient has been seen for MD follow up this am.  Patient has made some improvement in her ability to incorporate her right arm into ADL's, although continues to experience significant pain - with activity, and often at rest.  Pain is described as throbbing, burning, tingling, aching, and more recently is radiating upward toward shoulder.  Patient demonstrates a significant decrease in grip strength from evaluation to current (65 lbs initially - 40 lbs today)  Strengthening has not yet been a componenet of therapy program due to pain and range of motion limitations.  Patient is going to be discharged from OT services at this time due to limited benefit.  Recommend patient seek additional medical follow up for more exhaustive diagnosis of right forearm/ wrist dysfunction.  Referral has been placed according to patient.     Rehab Potential Good   OT Frequency 2x / week   OT Duration 6 weeks   OT Treatment/Interventions Self-care/ADL training;Electrical Stimulation;Cryotherapy;Moist Heat;Contrast  Bath;Fluidtherapy;Passive range of motion;Parrafin;DME and/or AE instruction;Therapeutic activities;Therapeutic exercises;Ultrasound;Neuromuscular education;Manual Therapy;Splinting;Therapeutic exercise;Patient/family education   Plan discharge until further work up determiness best course of action to relieve symptoms   OT Home Exercise Plan Education provided:  initial AROM HEP, wrist  splint wear/care, contrast bath   Consulted and Agree with Plan of Care Patient      Patient will benefit from skilled therapeutic intervention in order to improve the following deficits and impairments:  Decreased knowledge of use of DME, Impaired UE functional use, Pain, Decreased strength, Decreased activity tolerance, Impaired sensation, Decreased endurance, Decreased range of motion  Visit Diagnosis: Pain In Right Arm  Stiffness of right wrist, not elsewhere classified  Stiffness of right elbow, not elsewhere classified  Muscle weakness (generalized)    Problem List Patient Active Problem List   Diagnosis Date Noted  . Bilateral knee pain 03/15/2015  . Neck pain 01/04/2014  . Low back pain 01/04/2014   OCCUPATIONAL THERAPY DISCHARGE SUMMARY  Visits from Start of Care: 7  Current functional level related to goals / functional outcomes: Patient with significant impairments remaining, and OT discontinued to allow further medical  work up of her condition.   Patient may then be a better candidate in the future for OT services to help her regain full use of her dominant right arm.   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    Education / Equipment: Splint wear and care, HEP for range of motion wrist and forearm, adaptive techniques for IADL Plan: Patient agrees to discharge.  Patient goals were not met. Patient is being discharged due to lack of  progress.  ?????      Mariah Milling, OTR/L 09/13/2015, 11:04 AM  [facility] [address] [place], [place], [address] Phone: [phone]   Fax:  [phone]  Name: Shelby Moreno MRN: [id number] Date of Birth: 25-Mar-1970

## 2015-09-17 ENCOUNTER — Encounter: Payer: Self-pay | Admitting: Occupational Therapy

## 2015-09-20 ENCOUNTER — Encounter: Payer: Self-pay | Admitting: Occupational Therapy

## 2015-10-21 ENCOUNTER — Emergency Department (HOSPITAL_BASED_OUTPATIENT_CLINIC_OR_DEPARTMENT_OTHER)
Admission: EM | Admit: 2015-10-21 | Discharge: 2015-10-22 | Disposition: A | Discharge status: Home / Self Care | Payer: Self-pay | Attending: Physician Assistant | Admitting: Physician Assistant

## 2015-10-21 ENCOUNTER — Emergency Department (HOSPITAL_BASED_OUTPATIENT_CLINIC_OR_DEPARTMENT_OTHER): Payer: Self-pay

## 2015-10-21 ENCOUNTER — Encounter (HOSPITAL_BASED_OUTPATIENT_CLINIC_OR_DEPARTMENT_OTHER): Payer: Self-pay | Admitting: Emergency Medicine

## 2015-10-21 DIAGNOSIS — Z79899 Other long term (current) drug therapy: Secondary | ICD-10-CM | POA: Insufficient documentation

## 2015-10-21 DIAGNOSIS — M79631 Pain in right forearm: Secondary | ICD-10-CM | POA: Insufficient documentation

## 2015-10-21 DIAGNOSIS — F17210 Nicotine dependence, cigarettes, uncomplicated: Secondary | ICD-10-CM | POA: Insufficient documentation

## 2015-10-21 DIAGNOSIS — M79603 Pain in arm, unspecified: Secondary | ICD-10-CM

## 2015-10-21 NOTE — ED Notes (Signed)
Pt states that she injured her arm in June while moving a patient. Has never had an xray and is requesting one at this time. Numbness/tingling to extremity. Right grip< left. +radial pulses palp

## 2015-10-21 NOTE — ED Provider Notes (Signed)
[facility] DEPT [facility] Provider Note   CSN: [id number] Arrival date & time: 10/21/15  2116  By signing my name below, I, [name], attest that this documentation has been prepared under the direction and in the presence of Marshal Eskew Julio Alm, MD.  Electronically Signed: Julien Nordmann, ED Scribe. 10/21/15. 10:36 PM.    History   Chief Complaint Chief Complaint  Patient presents with  . Arm Pain    The history is provided by the patient. No language interpreter was used.   HPI Comments: Shelby Moreno is a 45 y.o. female who presents to the Emergency Department complaining of recurrent, intermittent, gradual worsening, right arm pain onset 4 months ago. 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 Pt was seen at [facility] to be evaluated but had no imaging done. She has also followed up with her PCP as well as orthopedist and reports having therapy done with no relief. She has not taken any medication to alleviate her pain. There are no other complaints.  Past Medical History:  Diagnosis Date  . Back pain   . Migraine   . Vertigo     Patient Active Problem List   Diagnosis Date Noted  . Bilateral knee pain 03/15/2015  . Neck pain 01/04/2014  . Low back pain 01/04/2014    Past Surgical History:  Procedure Laterality Date  . ABLATION    . CESAREAN SECTION    . TUBAL LIGATION      OB History    No data available       Home Medications    Prior to Admission medications   Medication Sig Start Date End Date Taking? Authorizing Provider  amitriptyline (ELAVIL) 10 MG tablet Take 10 mg by mouth at bedtime.    Historical Provider, MD  HYDROcodone-acetaminophen (NORCO/VICODIN) 5-325 MG tablet Take 1 tablet by mouth every 6 (six) hours as needed. 03/13/15   Dene Gentry, MD  meclizine (ANTIVERT) 25 MG tablet Take 25 mg by mouth 3 (three) times daily as needed for dizziness.     Historical Provider, MD  meloxicam (MOBIC) 15 MG tablet Take 1 tablet (15 mg total) by mouth daily. Patient not taking: Reported on 08/21/2015 03/13/15   Dene Gentry, MD  methocarbamol (ROBAXIN) 500 MG tablet Take 1 tablet (500 mg total) by mouth 2 (two) times daily. Patient not taking: Reported on 08/21/2015 03/15/15   April Palumbo, MD  SUMAtriptan Dellis Filbert) 50 MG tablet Reported on 08/21/2015 07/17/14   Historical Provider, MD  temazepam (RESTORIL) 22.5 MG capsule  06/04/14   Historical Provider, MD  tinidazole (TINDAMAX) 500 MG tablet Reported on 08/21/2015 03/06/15   Historical Provider, MD    Family History History reviewed. No pertinent family history.  Social History Social History  Substance Use Topics  . Smoking status: Current Every Day Smoker    Packs/day: 0.50    Years: 27.00    Types: Cigarettes  . Smokeless tobacco: Never Used  . Alcohol use 0.6 oz/week    1 Glasses of wine per week     Comment: occasions      Allergies   Augmentin [amoxicillin-pot clavulanate]; Keflex [cephalexin]; Naproxen; Zithromax [azithromycin]; and Morphine and related   Review of Systems Review of Systems  A complete 10 system review of systems was obtained and all systems are negative except as noted in the HPI and PMH.   Physical  Exam Updated Vital Signs BP 116/73 (BP Location: Left Arm)   Pulse 97   Temp 98.1 F (36.7 C) (Oral)   Resp 18   Ht 5\' 6"  (1.676 m)   Wt 160 lb (72.6 kg)   LMP 09/11/2015 (Approximate)   SpO2 100%   BMI 25.82 kg/m   Physical Exam  Constitutional: She is oriented to person, place, and time. She appears well-developed and well-nourished.  HENT:  Head: Normocephalic.  Eyes: EOM are normal.  Neck: Normal range of motion.  Pulmonary/Chest: Effort normal.  Abdominal: She exhibits no distension.  Musculoskeletal: Normal range of motion. She exhibits tenderness.  Tenderness to right forearm  Neurological: She is alert and oriented to person, place, and  time.  Psychiatric: She has a normal mood and affect.  Nursing note and vitals reviewed.    ED Treatments / Results  DIAGNOSTIC STUDIES: Oxygen Saturation is 100% on RA, normal by my interpretation.  COORDINATION OF CARE:  10:35 PM Discussed treatment plan with pt at bedside and pt agreed to plan.  Labs (all labs ordered are listed, but only abnormal results are displayed) Labs Reviewed - No data to display  EKG  EKG Interpretation None       Radiology No results found.  Procedures Procedures (including critical care time)  Medications Ordered in ED Medications - No data to display   Initial Impression / Assessment and Plan / ED Course  I have reviewed the triage vital signs and the nursing notes.  Pertinent labs & imaging results that were available during my care of the patient were reviewed by me and considered in my medical decision making (see chart for details).  Clinical Course   Patient is a 44 year old female with pain in her right forearm. Patient's had this since the end of July when she had an accident at work. Patient said she was lifting a patient and the patient landed on her arm. Patient been seen by [facility] occupational health, given referral to physical therapy and also seen by orthopedist. She is here for the same pain. Nothing changed no new injuries. She is a accompanied by husband. Husband would like an x-ray because she's not had none done. We will gave an x-ray.If negative, we will discharge patient to follow up with her multiple specialists involved. She has what appears to be full range of motion and sensation is intact distally.  Final Clinical Impressions(s) / ED Diagnoses   Final diagnoses:  None  I personally performed the services described in this documentation, which was scribed in my presence. The recorded information has been reviewed and is accurate.    New Prescriptions New Prescriptions   No medications on file       Mozetta Murfin Julio Alm, MD 10/21/15 2349

## 2015-10-21 NOTE — ED Triage Notes (Addendum)
Patient reports pain to right arm after injury at work on June 28.  Reports that she has tingling and numbness since injury occurred.

## 2015-10-21 NOTE — Discharge Instructions (Signed)
We are sorry about the pain in her arm. Please follow-up with your occupational doctor., orthopedist, physical therapist. We did not see any fractures on xray at this time.

## 2015-10-22 NOTE — ED Notes (Signed)
Pt given d/c instructions as per chart. Verbalizes understanding. No questions. 

## 2015-11-15 ENCOUNTER — Encounter: Payer: Self-pay | Admitting: Family Medicine

## 2015-11-15 ENCOUNTER — Ambulatory Visit (INDEPENDENT_AMBULATORY_CARE_PROVIDER_SITE_OTHER): Payer: Self-pay | Admitting: Family Medicine

## 2015-11-15 DIAGNOSIS — M79601 Pain in right arm: Secondary | ICD-10-CM

## 2015-11-15 MED ORDER — NORTRIPTYLINE HCL 25 MG PO CAPS: 25.0000 mg | ORAL_CAPSULE | Freq: Every day | ORAL | 2 refills | Status: DC

## 2015-11-15 NOTE — Patient Instructions (Addendum)
You have a forearm strain, nerve and bone contusions. I would continue with the wrist brace. Occupational therapy is an option but this seemed to make your symptoms worse. Try nortriptyline 25mg  at bedtime. Ok to take aleve or ibuprofen with this. Call me in a week if you're doing well and we could go up on the nortriptyline.

## 2015-11-24 DIAGNOSIS — M79601 Pain in right arm: Secondary | ICD-10-CM | POA: Insufficient documentation

## 2015-11-24 NOTE — Progress Notes (Signed)
PCP: PALMER, CHARLES  Subjective:   HPI: Patient is a 45 y.o. female here for right wrist injury.  Patient reports when at work on 6/28 she was lifting a patient, putting them on a bedpan and the patient rolled over her right arm. Patient landed on her volar forearm around the forearm. She did physical therapy through workers comp but seemed to make her symptoms worse. Slight swelling. Currently pain 0/10, dull pain. Worse with movements of wrist. No skin changes. Some tingling into fingers.  Past Medical History:  Diagnosis Date  . Back pain   . Migraine   . Vertigo     Current Outpatient Prescriptions on File Prior to Visit  Medication Sig Dispense Refill  . meclizine (ANTIVERT) 25 MG tablet Take 25 mg by mouth 3 (three) times daily as needed for dizziness.    . meloxicam (MOBIC) 15 MG tablet Take 1 tablet (15 mg total) by mouth daily. (Patient not taking: Reported on 08/21/2015) 30 tablet 2  . SUMAtriptan (IMITREX) 50 MG tablet Reported on 08/21/2015  2  . temazepam (RESTORIL) 22.5 MG capsule   5  . tinidazole (TINDAMAX) 500 MG tablet Reported on 08/21/2015  1   No current facility-administered medications on file prior to visit.     Past Surgical History:  Procedure Laterality Date  . ABLATION    . CESAREAN SECTION    . TUBAL LIGATION      Allergies  Allergen Reactions  . Augmentin [Amoxicillin-Pot Clavulanate] Hives  . Keflex [Cephalexin] Hives and Other (See Comments)    Also respiratory depression  . Naproxen Hives  . Zithromax [Azithromycin] Hives  . Morphine And Related   . Naproxen Sodium     Social History   Social History  . Marital status: Legally Separated    Spouse name: N/A  . Number of children: N/A  . Years of education: N/A   Occupational History  . Not on file.   Social History Main Topics  . Smoking status: Current Every Day Smoker    Packs/day: 0.50    Years: 27.00    Types: Cigarettes  . Smokeless tobacco: Never Used  . Alcohol  use 0.6 oz/week    1 Glasses of wine per week     Comment: occasions   . Drug use: No  . Sexual activity: Yes    Birth control/ protection: Surgical   Other Topics Concern  . Not on file   Social History Narrative  . No narrative on file    No family history on file.  BP 124/80   Pulse 74   Ht 5\' 6"  (1.676 m)   Wt 155 lb (70.3 kg)   LMP 09/11/2015 (Approximate)   BMI 25.02 kg/m   Review of Systems: See HPI above.    Objective:  Physical Exam:  Gen: NAD, comfortable in exam room  Right wrist/forearm: No gross deformity, swelling, bruising. TTP in forearm extensors and flexors.  No other tenderness. FROM wrist and elbow with pain on wrist flexion and extension. Collateral ligaments intact of elbow. Negative tinels carpal tunnel  Left wrist/forearm:  FROM without pain.    Assessment & Plan:  1. Right wrist injury - Independently reviewed forearm radiographs and these were negative.  Exam also reassuring.  Encouraged to use wrist brace, try nortriptyline for tingling.  Ibuprofen as needed.  Can consider repeating occupational therapy but she did not tolerate this very well.  Call us in a week for an update on her status -  consider increasing dosage.  F/u in 6 weeks.

## 2015-11-24 NOTE — Assessment & Plan Note (Signed)
Independently reviewed forearm radiographs and these were negative.  Exam also reassuring.  Encouraged to use wrist brace, try nortriptyline for tingling.  Ibuprofen as needed.  Can consider repeating occupational therapy but she did not tolerate this very well.  Call us in a week for an update on her status - consider increasing dosage.  F/u in 6 weeks.

## 2016-01-06 ENCOUNTER — Encounter (HOSPITAL_BASED_OUTPATIENT_CLINIC_OR_DEPARTMENT_OTHER): Payer: Self-pay | Admitting: Adult Health

## 2016-01-06 ENCOUNTER — Emergency Department (HOSPITAL_BASED_OUTPATIENT_CLINIC_OR_DEPARTMENT_OTHER)
Admission: EM | Admit: 2016-01-06 | Discharge: 2016-01-06 | Disposition: A | Discharge status: Home / Self Care | Payer: Self-pay | Attending: Emergency Medicine | Admitting: Emergency Medicine

## 2016-01-06 DIAGNOSIS — F17210 Nicotine dependence, cigarettes, uncomplicated: Secondary | ICD-10-CM | POA: Insufficient documentation

## 2016-01-06 DIAGNOSIS — G43401 Hemiplegic migraine, not intractable, with status migrainosus: Secondary | ICD-10-CM | POA: Insufficient documentation

## 2016-01-06 DIAGNOSIS — Z79899 Other long term (current) drug therapy: Secondary | ICD-10-CM | POA: Insufficient documentation

## 2016-01-06 MED ORDER — DEXAMETHASONE SODIUM PHOSPHATE 10 MG/ML IJ SOLN
10.0000 mg | Freq: Once | INTRAMUSCULAR | Status: AC
  Administered 2016-01-06: 10 mg via INTRAVENOUS
  Filled 2016-01-06: qty 1

## 2016-01-06 MED ORDER — ACETAMINOPHEN 500 MG PO TABS
1000.0000 mg | ORAL_TABLET | Freq: Once | ORAL | Status: AC
Start: 2016-01-06 — End: 2016-01-06
  Administered 2016-01-06: 1000 mg via ORAL
  Filled 2016-01-06: qty 2

## 2016-01-06 MED ORDER — PROCHLORPERAZINE EDISYLATE 5 MG/ML IJ SOLN
10.0000 mg | Freq: Once | INTRAMUSCULAR | Status: AC
  Administered 2016-01-06: 10 mg via INTRAVENOUS
  Filled 2016-01-06: qty 2

## 2016-01-06 MED ORDER — DIPHENHYDRAMINE HCL 50 MG/ML IJ SOLN
25.0000 mg | Freq: Once | INTRAMUSCULAR | Status: AC
  Administered 2016-01-06: 25 mg via INTRAVENOUS
  Filled 2016-01-06: qty 1

## 2016-01-06 MED ORDER — SODIUM CHLORIDE 0.9 % IV BOLUS (SEPSIS)
500.0000 mL | Freq: Once | INTRAVENOUS | Status: AC
  Administered 2016-01-06: 500 mL via INTRAVENOUS

## 2016-01-06 NOTE — ED Triage Notes (Signed)
Presents with migraine headache that began Wednesday associated with sinus pain and pressure, light and sound sensitivity, nauea and "My vertigo is acting up with it as well"  Endorses numbness, tingling and weakness down left side on Friday which is not common with Migraines. When she was sitting at [facility] today the headache and blurry vision got worse. THis is the worse it has been since Thursday. Denies numbness and tingling now. Equal grips bilaterally, no facial droop, no slurred speech. No drift.

## 2016-01-06 NOTE — ED Notes (Addendum)
Pt reports HA to L side and states this feels like her normal migraines. Pt states she doesn't have any medication at home to take for same. Endorses nausea, no vomiting.

## 2016-01-06 NOTE — ED Provider Notes (Signed)
[facility] DEPT [facility] Provider Note   CSN: [id number] Arrival date & time: 01/06/16  1339  By signing my name below, I, Shelby Moreno, attest that this documentation has been prepared under the direction and in the presence of Fatima Blank, MD.  Electronically Signed: Julien Nordmann, ED Scribe. 01/06/16. 3:32 PM.    History   Chief Complaint Chief Complaint  Patient presents with  . Headache    The history is provided by the patient. No language interpreter was used.   HPI Comments: Shelby Moreno is a 45 y.o. female who has a PMhx of migraines and vertigo presents to the Emergency Department complaining of a recurrent, gradual improving, 5/10, left sided headache that radiates to the right side x 5 days. Pt describes her pain as waxing and waning and sharp. She reports having blurry vision, nausea, photophobia, mild phonophobia, and mild LUE weakness that has resolved. Pt further states having a low grade fever (tmax 99.1) once four days ago. Pt has not taken any medication to alleviate her pain PTA. She does not see a neurologist for her migraines.   She states she has been staying hydrated with water, coffee, and occasionally sprite. Pt reports that her symptoms are similar to prior episodes of migraines.   Pt has a past abdominal surgical hx of tubal ligation and abdominal ablation. She denies vomiting, chest pain, shortness of breath, or recreational drugs.  Past Medical History:  Diagnosis Date  . Back pain   . Migraine   . Vertigo     Patient Active Problem List   Diagnosis Date Noted  . Right arm pain 11/24/2015  . Bilateral knee pain 03/15/2015  . Neck pain 01/04/2014  . Low back pain 01/04/2014    Past Surgical History:  Procedure Laterality Date  . ABLATION    . CESAREAN SECTION    . TUBAL LIGATION      OB History    No data available       Home Medications    Prior to Admission medications   Medication Sig Start Date End Date Taking?  Authorizing Provider  meclizine (ANTIVERT) 25 MG tablet Take 25 mg by mouth 3 (three) times daily as needed for dizziness.    Historical Provider, MD  meloxicam (MOBIC) 15 MG tablet Take 1 tablet (15 mg total) by mouth daily. Patient not taking: Reported on 08/21/2015 03/13/15   Dene Gentry, MD  nortriptyline (PAMELOR) 25 MG capsule Take 1 capsule (25 mg total) by mouth at bedtime. 11/15/15   Dene Gentry, MD  SUMAtriptan (IMITREX) 50 MG tablet Reported on 08/21/2015 07/17/14   Historical Provider, MD  temazepam (RESTORIL) 22.5 MG capsule  06/04/14   Historical Provider, MD  tinidazole (TINDAMAX) 500 MG tablet Reported on 08/21/2015 03/06/15   Historical Provider, MD    Family History History reviewed. No pertinent family history.  Social History Social History  Substance Use Topics  . Smoking status: Current Every Day Smoker    Packs/day: 0.50    Years: 27.00    Types: Cigarettes  . Smokeless tobacco: Never Used  . Alcohol use 0.6 oz/week    1 Glasses of wine per week     Comment: occasions      Allergies   Augmentin [amoxicillin-pot clavulanate]; Keflex [cephalexin]; Naproxen; Zithromax [azithromycin]; Morphine and related; and Naproxen sodium   Review of Systems Review of Systems  A complete 10 system review of systems was obtained and all systems are negative except as noted in  the HPI and PMH.    Physical Exam Updated Vital Signs BP 133/93 (BP Location: Right Arm)   Pulse 83   Temp 98 F (36.7 C) (Oral)   Resp 22   Ht 5\' 6"  (1.676 m)   Wt 150 lb (68 kg)   SpO2 100%   BMI 24.21 kg/m   Physical Exam  Constitutional: She is oriented to person, place, and time. She appears well-developed and well-nourished. No distress.  HENT:  Head: Normocephalic and atraumatic.  Nose: Nose normal.  Eyes: Conjunctivae and EOM are normal. Pupils are equal, round, and reactive to light. Right eye exhibits no discharge. Left eye exhibits no discharge. No scleral icterus.  Neck:  Normal range of motion. Neck supple.  Cardiovascular: Normal rate and regular rhythm.  Exam reveals no gallop and no friction rub.   No murmur heard. Pulmonary/Chest: Effort normal and breath sounds normal. No stridor. No respiratory distress. She has no rales.  Abdominal: Soft. She exhibits no distension. There is no tenderness.  Musculoskeletal: She exhibits no edema or tenderness.  Neurological: She is alert and oriented to person, place, and time.  Mental Status: Alert and oriented to person, place, and time. Attention and concentration normal. Speech clear. Recent memory is intac  Cranial Nerves  II Visual Fields: Intact to confrontation. Visual fields intact. III, IV, VI: Pupils equal and reactive to light and near. Full eye movement without nystagmus  V Facial Sensation: Normal. No weakness of masticatory muscles  VII: No facial weakness or asymmetry  VIII Auditory Acuity: Grossly normal  IX/X: The uvula is midline; the palate elevates symmetrically  XI: Normal sternocleidomastoid and trapezius strength  XII: The tongue is midline. No atrophy or fasciculations.   Motor System: Muscle Strength: 5/5 and symmetric in the upper and lower extremities. No pronation or drift.  Muscle Tone: Tone and muscle bulk are normal in the upper and lower extremities.   Reflexes: DTRs: 2+ and symmetrical in all four extremities. Plantar responses are flexor bilaterally.  Coordination: Intact finger-to-nose. No tremor.  Sensation: Intact to light touch, and pinprick.  Gait: Routine gait normal    Skin: Skin is warm and dry. No rash noted. She is not diaphoretic. No erythema.  Psychiatric: She has a normal mood and affect.  Vitals reviewed.    ED Treatments / Results  DIAGNOSTIC STUDIES: Oxygen Saturation is 100% on RA, normal by my interpretation.  COORDINATION OF CARE:  3:31 PM Discussed treatment plan with pt at bedside and pt agreed to plan.  Labs (all labs ordered are listed, but only  abnormal results are displayed) Labs Reviewed - No data to display  EKG  EKG Interpretation None       Radiology No results found.  Procedures Procedures (including critical care time)  Medications Ordered in ED Medications  diphenhydrAMINE (BENADRYL) injection 25 mg (25 mg Intravenous Given 01/06/16 1509)  dexamethasone (DECADRON) injection 10 mg (10 mg Intravenous Given 01/06/16 1511)  prochlorperazine (COMPAZINE) injection 10 mg (10 mg Intravenous Given 01/06/16 1509)  sodium chloride 0.9 % bolus 500 mL (500 mLs Intravenous New Bag/Given 01/06/16 1507)  acetaminophen (TYLENOL) tablet 1,000 mg (1,000 mg Oral Given 01/06/16 1508)     Initial Impression / Assessment and Plan / ED Course  I have reviewed the triage vital signs and the nursing notes.  Pertinent labs & imaging results that were available during my care of the patient were reviewed by me and considered in my medical decision making (see chart for details).  Clinical Course as of Jan 06 1551  Nancy Fetter Jan 06, 2016  1547 Typical migraine headache for the pt. Non focal neuro exam. No recent head trauma. No current fever. Doubt meningitis. Doubt intracranial bleed. Doubt IIH. No indication for imaging. Treated with migraine cocktail and had significant improvement in symptomatology.   [PC]  1548 The patient is safe for discharge with strict return precautions.   [PC]    Clinical Course User Index [PC] Fatima Blank, MD     I personally performed the services described in this documentation, which was scribed in my presence. The recorded information has been reviewed and is accurate.      Final Clinical Impressions(s) / ED Diagnoses   Final diagnoses:  Hemiplegic migraine with status migrainosus, not intractable   Disposition: Discharge  Condition: Good  I have discussed the results, Dx and Tx plan with the patient who expressed understanding and agree(s) with the plan. Discharge instructions  discussed at great length. The patient was given strict return precautions who verbalized understanding of the instructions. No further questions at time of discharge.    Current Discharge Medication List      Follow Up: [facility] [address] [place], [place] [phone] Schedule an appointment as soon as possible for a visit        Fatima Blank, MD 01/06/16 1552

## 2016-01-06 NOTE — ED Notes (Signed)
ED Provider at bedside. 

## 2016-01-22 ENCOUNTER — Encounter (HOSPITAL_BASED_OUTPATIENT_CLINIC_OR_DEPARTMENT_OTHER): Payer: Self-pay | Admitting: Emergency Medicine

## 2016-01-22 ENCOUNTER — Emergency Department (HOSPITAL_BASED_OUTPATIENT_CLINIC_OR_DEPARTMENT_OTHER): Payer: Self-pay

## 2016-01-22 ENCOUNTER — Emergency Department (HOSPITAL_BASED_OUTPATIENT_CLINIC_OR_DEPARTMENT_OTHER)
Admission: EM | Admit: 2016-01-22 | Discharge: 2016-01-22 | Disposition: A | Discharge status: Home / Self Care | Payer: Self-pay | Attending: Emergency Medicine | Admitting: Emergency Medicine

## 2016-01-22 DIAGNOSIS — J189 Pneumonia, unspecified organism: Secondary | ICD-10-CM

## 2016-01-22 DIAGNOSIS — J181 Lobar pneumonia, unspecified organism: Secondary | ICD-10-CM | POA: Insufficient documentation

## 2016-01-22 DIAGNOSIS — F17210 Nicotine dependence, cigarettes, uncomplicated: Secondary | ICD-10-CM | POA: Insufficient documentation

## 2016-01-22 LAB — BASIC METABOLIC PANEL
Anion gap: 6 (ref 5–15)
BUN: 8 mg/dL (ref 6–20)
CALCIUM: 9.5 mg/dL (ref 8.9–10.3)
CO2: 28 mmol/L (ref 22–32)
CREATININE: 0.84 mg/dL (ref 0.44–1.00)
Chloride: 104 mmol/L (ref 101–111)
GFR calc Af Amer: >60 mL/min (ref >60)
GLUCOSE: 100 mg/dL — AB (ref 65–99)
Potassium: 3 mmol/L — ABNORMAL LOW (ref 3.5–5.1)
Sodium: 138 mmol/L (ref 135–145)

## 2016-01-22 LAB — CBC WITH DIFFERENTIAL/PLATELET
BASOS ABS: 0 10*3/uL (ref 0.0–0.1)
Basophils Relative: 0 %
EOS ABS: 0.4 10*3/uL (ref 0.0–0.7)
EOS PCT: 5 %
HCT: 38.3 % (ref 36.0–46.0)
Hemoglobin: 13.2 g/dL (ref 12.0–15.0)
LYMPHS ABS: 1.5 10*3/uL (ref 0.7–4.0)
LYMPHS PCT: 19 %
MCH: 30.7 pg (ref 26.0–34.0)
MCHC: 34.5 g/dL (ref 30.0–36.0)
MCV: 89.1 fL (ref 78.0–100.0)
MONO ABS: 0.9 10*3/uL (ref 0.1–1.0)
Monocytes Relative: 11 %
Neutro Abs: 5 10*3/uL (ref 1.7–7.7)
Neutrophils Relative %: 65 %
PLATELETS: 196 10*3/uL (ref 150–400)
RBC: 4.3 MIL/uL (ref 3.87–5.11)
RDW: 13.5 % (ref 11.5–15.5)
WBC: 7.8 10*3/uL (ref 4.0–10.5)

## 2016-01-22 LAB — URINALYSIS, ROUTINE W REFLEX MICROSCOPIC
BILIRUBIN URINE: NEGATIVE
GLUCOSE, UA: NEGATIVE mg/dL
KETONES UR: NEGATIVE mg/dL
Leukocytes, UA: NEGATIVE
Nitrite: NEGATIVE
PROTEIN: NEGATIVE mg/dL
Specific Gravity, Urine: 1.008 (ref 1.005–1.030)
pH: 7 (ref 5.0–8.0)

## 2016-01-22 LAB — URINALYSIS, MICROSCOPIC (REFLEX): WBC, UA: NONE SEEN WBC/hpf (ref 0–5)

## 2016-01-22 MED ORDER — BENZONATATE 100 MG PO CAPS
100.0000 mg | ORAL_CAPSULE | Freq: Once | ORAL | Status: AC
  Administered 2016-01-22: 100 mg via ORAL
  Filled 2016-01-22: qty 1

## 2016-01-22 MED ORDER — SODIUM CHLORIDE 0.9 % IV BOLUS (SEPSIS)
1000.0000 mL | Freq: Once | INTRAVENOUS | Status: AC
  Administered 2016-01-22: 1000 mL via INTRAVENOUS

## 2016-01-22 MED ORDER — DOXYCYCLINE HYCLATE 100 MG PO CAPS: 100.0000 mg | ORAL_CAPSULE | Freq: Two times a day (BID) | ORAL | 0 refills | Status: AC

## 2016-01-22 MED ORDER — DOXYCYCLINE HYCLATE 100 MG PO TABS
100.0000 mg | ORAL_TABLET | Freq: Once | ORAL | Status: AC
  Administered 2016-01-22: 100 mg via ORAL
  Filled 2016-01-22: qty 1

## 2016-01-22 MED ORDER — POTASSIUM CHLORIDE CRYS ER 20 MEQ PO TBCR
40.0000 meq | EXTENDED_RELEASE_TABLET | Freq: Once | ORAL | Status: AC
  Administered 2016-01-22: 40 meq via ORAL
  Filled 2016-01-22: qty 2

## 2016-01-22 MED ORDER — ACETAMINOPHEN 325 MG PO TABS
650.0000 mg | ORAL_TABLET | Freq: Once | ORAL | Status: AC
Start: 2016-01-22 — End: 2016-01-22
  Administered 2016-01-22: 650 mg via ORAL

## 2016-01-22 MED ORDER — ACETAMINOPHEN 325 MG PO TABS
ORAL_TABLET | ORAL | Status: AC
  Administered 2016-01-22: 650 mg via ORAL
  Filled 2016-01-22: qty 2

## 2016-01-22 MED FILL — DOXYCYCLINE HYC 100 MG CAP: 100 | 7 days supply | Qty: 14 | Fill #0

## 2016-01-22 NOTE — ED Provider Notes (Signed)
[facility] DEPT [facility] Provider Note   CSN: [id number] Arrival date & time: 01/22/16  [id number]     History   Chief Complaint Chief Complaint  Patient presents with  . URI    HPI Shelby Moreno is a 45 y.o. female With a past medical history significant for Vertigo and migraines who presents with rhinorrhea, congestion, Myalgias, cough, subjective fevers, and chills. Patient reports that her symptoms have been ongoing and worsening for the last four days. Patient reports a clear sputum with her cough. Patient reports slightly decreased PO intake given her symptoms. No nausea, vomiting, abdominal pain, constipation, diarrhea, or dysuria. Patient denies other symptoms on arrival. Patient denies chest pain or shortness of breath.    The history is provided by the patient and medical records. No language interpreter was used.  URI   This is a new problem. The current episode started more than 2 days ago. The problem has been gradually worsening. Maximum temperature: subjective. The fever has been present for less than 1 day. Associated symptoms include congestion, rhinorrhea and cough. Pertinent negatives include no chest pain, no abdominal pain, no nausea, no vomiting, no dysuria, no headaches, no sore throat, no neck pain and no wheezing. She has tried nothing for the symptoms. The treatment provided no relief.    Past Medical History:  Diagnosis Date  . Back pain   . Migraine   . Vertigo     Patient Active Problem List   Diagnosis Date Noted  . Right arm pain 11/24/2015  . Bilateral knee pain 03/15/2015  . Neck pain 01/04/2014  . Low back pain 01/04/2014    Past Surgical History:  Procedure Laterality Date  . ABLATION    . CESAREAN SECTION    . TUBAL LIGATION      OB History    No data available       Home Medications    Prior to Admission medications   Medication Sig Start Date End Date Taking? Authorizing Provider  meclizine (ANTIVERT) 25 MG tablet Take 25 mg  by mouth 3 (three) times daily as needed for dizziness.    Historical Provider, MD  meloxicam (MOBIC) 15 MG tablet Take 1 tablet (15 mg total) by mouth daily. Patient not taking: Reported on 08/21/2015 03/13/15   Dene Gentry, MD  SUMAtriptan (IMITREX) 50 MG tablet Reported on 08/21/2015 07/17/14   Historical Provider, MD  temazepam (RESTORIL) 22.5 MG capsule  06/04/14   Historical Provider, MD  tinidazole (TINDAMAX) 500 MG tablet Reported on 08/21/2015 03/06/15   Historical Provider, MD    Family History No family history on file.  Social History Social History  Substance Use Topics  . Smoking status: Current Every Day Smoker    Packs/day: 0.50    Years: 27.00    Types: Cigarettes  . Smokeless tobacco: Never Used  . Alcohol use 0.6 oz/week    1 Glasses of wine per week     Comment: occasions      Allergies   Augmentin [amoxicillin-pot clavulanate]; Keflex [cephalexin]; Naproxen; Zithromax [azithromycin]; and Morphine and related   Review of Systems Review of Systems  Constitutional: Positive for appetite change, chills, fatigue and fever. Negative for diaphoresis.  HENT: Positive for congestion and rhinorrhea. Negative for sore throat.   Eyes: Negative for visual disturbance.  Respiratory: Positive for cough. Negative for chest tightness, shortness of breath, wheezing and stridor.   Cardiovascular: Negative for chest pain, palpitations and leg swelling.  Gastrointestinal: Negative for abdominal pain, nausea  and vomiting.  Genitourinary: Negative for dysuria and flank pain.  Musculoskeletal: Negative for back pain and neck pain.  Skin: Negative for wound.  Neurological: Negative for dizziness, seizures, syncope, light-headedness, numbness and headaches.  All other systems reviewed and are negative.    Physical Exam Updated Vital Signs BP 142/78 (BP Location: Left Arm)   Pulse 100   Temp 99.9 F (37.7 C) (Oral)   Resp 18   Ht 5\' 7"  (1.702 m)   Wt 145 lb (65.8 kg)    SpO2 99%   BMI 22.71 kg/m   Physical Exam  Constitutional: She appears well-developed and well-nourished. No distress.  HENT:  Head: Normocephalic and atraumatic.  Eyes: Conjunctivae are normal.  Neck: Neck supple.  Cardiovascular: Normal rate and regular rhythm.   No murmur heard. Pulmonary/Chest: Effort normal. No tachypnea. No respiratory distress. She has no wheezes. She has rhonchi. She has no rales. She exhibits no tenderness.  Abdominal: Soft. There is no tenderness.  Musculoskeletal: She exhibits no edema.  Neurological: She is alert.  Skin: Skin is warm and dry.  Psychiatric: She has a normal mood and affect.  Nursing note and vitals reviewed.    ED Treatments / Results  Labs (all labs ordered are listed, but only abnormal results are displayed) Labs Reviewed  URINALYSIS, ROUTINE W REFLEX MICROSCOPIC - Abnormal; Notable for the following:       Result Value   APPearance CLOUDY (*)    Hgb urine dipstick TRACE (*)    All other components within normal limits  BASIC METABOLIC PANEL - Abnormal; Notable for the following:    Potassium 3.0 (*)    Glucose, Bld 100 (*)    All other components within normal limits  URINALYSIS, MICROSCOPIC (REFLEX) - Abnormal; Notable for the following:    Bacteria, UA MANY (*)    Squamous Epithelial / LPF 6-30 (*)    All other components within normal limits  CBC WITH DIFFERENTIAL/PLATELET    EKG  EKG Interpretation None       Radiology Dg Chest 2 View  Result Date: 01/22/2016 CLINICAL DATA:  Sore throat, postnasal drip, runny nose, nonproductive cough, generalized aches and pains, low-grade fever and chills since Saturday EXAM: CHEST  2 VIEW COMPARISON:  None FINDINGS: Normal heart size, mediastinal contours, and pulmonary vascularity. Question subtle RIGHT upper lobe infiltrate. Remaining lungs clear. No pleural effusion or pneumothorax. Osseous structures unremarkable. IMPRESSION: Question subtle RIGHT upper lobe infiltrate.  Electronically Signed   By: Lavonia Dana M.D.   On: 01/22/2016 08:38    Procedures Procedures (including critical care time)  Medications Ordered in ED Medications  sodium chloride 0.9 % bolus 1,000 mL (0 mLs Intravenous Stopped 01/22/16 1051)  sodium chloride 0.9 % bolus 1,000 mL (0 mLs Intravenous Stopped 01/22/16 0950)  acetaminophen (TYLENOL) tablet 650 mg (650 mg Oral Given 01/22/16 0919)  benzonatate (TESSALON) capsule 100 mg (100 mg Oral Given 01/22/16 0949)  potassium chloride SA (K-DUR,KLOR-CON) CR tablet 40 mEq (40 mEq Oral Given 01/22/16 1028)  doxycycline (VIBRA-TABS) tablet 100 mg (100 mg Oral Given 01/22/16 1028)     Initial Impression / Assessment and Plan / ED Course  I have reviewed the triage vital signs and the nursing notes.  Pertinent labs & imaging results that were available during my care of the patient were reviewed by me and considered in my medical decision making (see chart for details).  Clinical Course    Shelby Moreno is a 44 y.o. female With a  past medical history significant for Vertigo and migraines who presents with rhinorrhea, congestion, Myalgias, cough, subjective fevers, and chills.  On exam, patient had some course breath sounds. Congestion also heard in rhinorrhea seen. No chest tenderness were abdominal tenderness. No focal neurologic deficits.  Given multitude of symptoms, suspected URI, likely viral, however, with course breath sounds, chest x-ray was ordered. X-ray showed concern for possible pneumonia. Given cough, breath sounds, and sputum production, patient we treated for community acquired pneumonia. Given Allergy list, patient given prescription for doxycycline for management.  Patient felt better after fluids. Patient given potassium for hypokalemia. Patient given doxycycline in ED. Patient given Tessalon for cough.  Patient Felt appropriate for discharge given improvement in symptoms and Diagnosis of pneumonia. Patient understood  plans to follow up with PCP in several days as well as return precautions. Patient had no other questions or concerns and was discharged in good condition.     Final Clinical Impressions(s) / ED Diagnoses   Final diagnoses:  Community acquired pneumonia of right upper lobe of lung ([facility])    New Prescriptions Discharge Medication List as of 01/22/2016 11:17 AM    START taking these medications   Details  doxycycline (VIBRAMYCIN) 100 MG capsule Take 1 capsule (100 mg total) by mouth 2 (two) times daily., Starting Tue 01/22/2016, Until Tue 01/29/2016, Print        Clinical Impression: 1. Community acquired pneumonia of right upper lobe of lung ([facility])     Disposition: Discharge  Condition: Good  I have discussed the results, Dx and Tx plan with the pt(& family if present). He/she/they expressed understanding and agree(s) with the plan. Discharge instructions discussed at great length. Strict return precautions discussed and pt &/or family have verbalized understanding of the instructions. No further questions at time of discharge.    Discharge Medication List as of 01/22/2016 11:17 AM    START taking these medications   Details  doxycycline (VIBRAMYCIN) 100 MG capsule Take 1 capsule (100 mg total) by mouth 2 (two) times daily., Starting Tue 01/22/2016, Until Tue 01/29/2016, Print        Follow Up: Maralyn Sago [address] [place] [address] [phone]  Schedule an appointment as soon as possible for a visit    [facility] [address] [id number] [facility] [address] [place] [phone]  If symptoms worsen     Courtney Paris, MD 01/22/16 2241

## 2016-01-22 NOTE — ED Triage Notes (Signed)
Pt having sore thorat, post nasal drip, runny nose, non productive cough, generalized aches and pains, low grade fever and chills since Saturday.  No acute respiratory distress noted.

## 2016-01-22 NOTE — Discharge Instructions (Signed)
Please follow-up with your primary care physician for further management of your pneumonia. If symptoms return or worsen, please return to the nearest ED. Please take your antibiotics as prescribed.

## 2016-02-22 DIAGNOSIS — F419 Anxiety disorder, unspecified: Secondary | ICD-10-CM | POA: Diagnosis not present

## 2016-02-22 DIAGNOSIS — F17200 Nicotine dependence, unspecified, uncomplicated: Secondary | ICD-10-CM | POA: Diagnosis not present

## 2016-02-22 DIAGNOSIS — Z883 Allergy status to other anti-infective agents status: Secondary | ICD-10-CM | POA: Diagnosis not present

## 2016-02-22 DIAGNOSIS — Z881 Allergy status to other antibiotic agents status: Secondary | ICD-10-CM | POA: Diagnosis not present

## 2016-02-22 DIAGNOSIS — Z880 Allergy status to penicillin: Secondary | ICD-10-CM | POA: Diagnosis not present

## 2016-02-22 DIAGNOSIS — Z885 Allergy status to narcotic agent status: Secondary | ICD-10-CM | POA: Diagnosis not present

## 2016-02-22 DIAGNOSIS — Z888 Allergy status to other drugs, medicaments and biological substances status: Secondary | ICD-10-CM | POA: Diagnosis not present

## 2016-02-22 DIAGNOSIS — S63602A Unspecified sprain of left thumb, initial encounter: Secondary | ICD-10-CM | POA: Diagnosis not present

## 2016-02-22 DIAGNOSIS — F410 Panic disorder [episodic paroxysmal anxiety] without agoraphobia: Secondary | ICD-10-CM | POA: Diagnosis not present

## 2016-03-04 ENCOUNTER — Ambulatory Visit (INDEPENDENT_AMBULATORY_CARE_PROVIDER_SITE_OTHER): Payer: 59 | Admitting: Family Medicine

## 2016-03-04 ENCOUNTER — Encounter: Payer: Self-pay | Admitting: Family Medicine

## 2016-03-04 DIAGNOSIS — M65311 Trigger thumb, right thumb: Secondary | ICD-10-CM | POA: Insufficient documentation

## 2016-03-04 DIAGNOSIS — M6530 Trigger finger, unspecified finger: Secondary | ICD-10-CM | POA: Diagnosis not present

## 2016-03-04 MED ORDER — METHYLPREDNISOLONE ACETATE 40 MG/ML IJ SUSP
20.0000 mg | Freq: Once | INTRAMUSCULAR | Status: AC
  Administered 2016-03-04: 20 mg via INTRA_ARTICULAR

## 2016-03-04 NOTE — Progress Notes (Signed)
PCP: PALMER, CHARLES  Subjective:   HPI: Patient is a 46 y.o. female here for left thumb pain.  Patient reports about 3 weeks ago she started to get pain in left thumb on palmar side. Pain up to 8/10, sharp. Associated with locking when bends at IP joint. Tried splint. Seen at urgent care and told thumb was sprained. Radiographs were negative for fracture. No skin changes, numbness. Right handed.  Past Medical History:  Diagnosis Date  . Back pain   . Migraine   . Vertigo     Current Outpatient Prescriptions on File Prior to Visit  Medication Sig Dispense Refill  . meclizine (ANTIVERT) 25 MG tablet Take 25 mg by mouth 3 (three) times daily as needed for dizziness.    . meloxicam (MOBIC) 15 MG tablet Take 1 tablet (15 mg total) by mouth daily. (Patient not taking: Reported on 08/21/2015) 30 tablet 2  . SUMAtriptan (IMITREX) 50 MG tablet Reported on 08/21/2015  2  . temazepam (RESTORIL) 22.5 MG capsule   5  . tinidazole (TINDAMAX) 500 MG tablet Reported on 08/21/2015  1   No current facility-administered medications on file prior to visit.     Past Surgical History:  Procedure Laterality Date  . ABLATION    . CESAREAN SECTION    . TUBAL LIGATION      Allergies  Allergen Reactions  . Augmentin [Amoxicillin-Pot Clavulanate] Hives  . Keflex [Cephalexin] Hives and Other (See Comments)    Also respiratory depression  . Naproxen Hives  . Zithromax [Azithromycin] Hives  . Morphine And Related Itching    Has to take benadryl with it    Social History   Social History  . Marital status: Legally Separated    Spouse name: N/A  . Number of children: N/A  . Years of education: N/A   Occupational History  . Not on file.   Social History Main Topics  . Smoking status: Current Every Day Smoker    Packs/day: 0.50    Years: 27.00    Types: Cigarettes  . Smokeless tobacco: Never Used  . Alcohol use 0.6 oz/week    1 Glasses of wine per week     Comment: occasions   .  Drug use: No  . Sexual activity: Yes    Birth control/ protection: Surgical   Other Topics Concern  . Not on file   Social History Narrative  . No narrative on file    No family history on file.  BP 121/78   Pulse 85   Ht 5\' 6"  (1.676 m)   Wt 155 lb (70.3 kg)   BMI 25.02 kg/m   Review of Systems: See HPI above.     Objective:  Physical Exam:  Gen: NAD, comfortable in exam room  Left hand/thumb: No gross deformity, swelling, bruising.  Catching with flexion of IP joint of thumb.  No malrotation or angulation. TTP at A1 pulley with nodule of thumb.  No TTP 1st dorsal compartment, CMC joint, carpal tunnel. FROM. Negative tinels and finkelsteins. NVI distally.  Right hand/thumb: FROM digits without pain.   Assessment & Plan:  1. Left trigger thumb - Discussed options - she would like injection which was given today.  Can continue splint for a week then as needed.  Heat, ibuprofen or aleve if needed.  F/u in 1 month or prn.  After informed written consent patient was seated in chair in exam room.  Area overlying left 1st digit A1 pulley prepped with alcohol swab  then injected with 0.5:0.32mL bupivicaine: depomedrol.  Patient tolerated [procedure well without immediate complications.

## 2016-03-04 NOTE — Assessment & Plan Note (Signed)
Discussed options - she would like injection which was given today.  Can continue splint for a week then as needed.  Heat, ibuprofen or aleve if needed.  F/u in 1 month or prn.  After informed written consent patient was seated in chair in exam room.  Area overlying left 1st digit A1 pulley prepped with alcohol swab then injected with 0.5:0.10mL bupivicaine: depomedrol.  Patient tolerated [procedure well without immediate complications.

## 2016-03-04 NOTE — Patient Instructions (Signed)
You have a trigger thumb. You were given a cortisone injection today. Heat 15 minutes at a time 3-4 times a day. Wear splint (thumbkeeper or thumb spica) as needed (hopefully after a week or so you won't need this at all). Ibuprofen or aleve if needed for pain, inflammation. Follow up with me in 1 month or as needed.

## 2016-03-04 NOTE — Addendum Note (Signed)
Addended by: Sherrie George F on: 03/04/2016 03:22 PM   Modules accepted: Orders

## 2016-03-07 ENCOUNTER — Emergency Department (HOSPITAL_BASED_OUTPATIENT_CLINIC_OR_DEPARTMENT_OTHER)
Admission: EM | Admit: 2016-03-07 | Discharge: 2016-03-07 | Disposition: A | Discharge status: Home / Self Care | Payer: 59 | Attending: Emergency Medicine | Admitting: Emergency Medicine

## 2016-03-07 ENCOUNTER — Encounter (HOSPITAL_BASED_OUTPATIENT_CLINIC_OR_DEPARTMENT_OTHER): Payer: Self-pay | Admitting: Emergency Medicine

## 2016-03-07 DIAGNOSIS — F17210 Nicotine dependence, cigarettes, uncomplicated: Secondary | ICD-10-CM | POA: Insufficient documentation

## 2016-03-07 DIAGNOSIS — Z79899 Other long term (current) drug therapy: Secondary | ICD-10-CM | POA: Insufficient documentation

## 2016-03-07 DIAGNOSIS — L0231 Cutaneous abscess of buttock: Secondary | ICD-10-CM | POA: Diagnosis not present

## 2016-03-07 MED ORDER — LIDOCAINE HCL (PF) 2 % IJ SOLN
INTRAMUSCULAR | Status: AC
  Filled 2016-03-07: qty 2

## 2016-03-07 MED ORDER — LIDOCAINE HCL 2 % IJ SOLN
5.0000 mL | Freq: Once | INTRAMUSCULAR | Status: AC
  Administered 2016-03-07: 100 mg

## 2016-03-07 MED ORDER — HYDROCODONE-ACETAMINOPHEN 5-325 MG PO TABS: 1.0000 | ORAL_TABLET | ORAL | 0 refills | Status: DC | PRN

## 2016-03-07 NOTE — ED Provider Notes (Addendum)
[facility] Provider Note: Shelby Spurling, MD, FACEP  CSN: [id number] MRN: [id number] ARRIVAL: 03/07/16 at [place]: Hud[place]eth [id number] is a 46 y.o. female with a tender swollen area just inferior and to the left of her coccyx. It is been there for about 3 days. It is steadily worsening. There is severe pain and tenderness associated with it. Pain is worse with movement or sitting. It has not been draining. She has had abscesses in the past but none this severe. She has not had a fever.   Past Medical History:  Diagnosis Date  . Back pain   . Migraine   . Vertigo     Past Surgical History:  Procedure Laterality Date  . ABLATION    . CESAREAN SECTION    . TUBAL LIGATION      No family history on file.  Social History  Substance Use Topics  . Smoking status: Current Every Day Smoker    Packs/day: 0.50    Years: 27.00    Types: Cigarettes  . Smokeless tobacco: Never Used  . Alcohol use 0.6 oz/week    1 Glasses of wine per week     Comment: occasions     Prior to Admission medications   Medication Sig Start Date End Date Taking? Authorizing Provider  HYDROcodone-acetaminophen (NORCO) 5-325 MG tablet Take 1 tablet by mouth every 4 (four) hours as needed (for pain). 03/07/16   Breyona Swander, MD  meclizine (ANTIVERT) 25 MG tablet Take 25 mg by mouth 3 (three) times daily as needed for dizziness.    Historical Provider, MD  SUMAtriptan (IMITREX) 50 MG tablet Reported on 08/21/2015 07/17/14   Historical Provider, MD  temazepam (RESTORIL) 22.5 MG capsule  06/04/14   Historical Provider, MD  tinidazole (TINDAMAX) 500 MG tablet Reported on 08/21/2015 03/06/15   Historical Provider, MD    Allergies Augmentin [amoxicillin-pot clavulanate]; Keflex [cephalexin]; Naproxen; Zithromax [azithromycin]; and Morphine and related   REVIEW OF SYSTEMS  Negative except as noted here or in the History of Present  Illness.   PHYSICAL EXAMINATION  Initial Vital Signs Blood pressure 135/88, pulse 97, temperature 98 F (36.7 C), temperature source Oral, resp. rate 18, SpO2 100 %.  Examination General: Well-developed, well-nourished female in no acute distress; appearance consistent with age of record HENT: normocephalic; atraumatic Eyes: Normal appearance Neck: supple Heart: regular rate and rhythm Lungs: clear to auscultation bilaterally Abdomen: soft; nondistended Extremities: No deformity; full range of motion Neurologic: Awake, alert and oriented; motor function intact in all extremities and symmetric; no facial droop Skin: Warm and dry; pointing abscess on the medial aspect of the left buttock just inferior to the coccyx Psychiatric: Normal mood and affect   RESULTS  Summary of this visit's results, reviewed by myself:   EKG Interpretation  Date/Time:    Ventricular Rate:    PR Interval:    QRS Duration:   QT Interval:    QTC Calculation:   R Axis:     Text Interpretation:        Laboratory Studies: No results found for this or any previous visit (from the past 24 hour(s)). Imaging Studies: No results found.  ED COURSE  Nursing notes and initial vitals signs, including pulse oximetry, reviewed.  Vitals:   03/07/16 0620  BP: 135/88  Pulse: 97  Resp: 18  Temp: 98 F (36.7 C)  TempSrc: Oral  SpO2: 100%  PROCEDURES   INCISION AND DRAINAGE Performed by: Shanon Rosser L Consent: Verbal consent obtained. Risks and benefits: risks, benefits and alternatives were discussed Type: abscess  Body area: Left buttock  Anesthesia: local infiltration  Incision was made with a scalpel.  Local anesthetic: lidocaine 2 % without epinephrine  Anesthetic total: 4 ml  Complexity: complex Blunt dissection to break up loculations  Drainage: purulent  Drainage amount: Profuse   Packing material: 1/4 in iodoform gauze  Patient tolerance: Patient tolerated the procedure  well with no immediate complications.  She was advised to remove the packing herself if it improves over the next 3 days. If it is worsening over the next 3 days she should have it reevaluated.   ED DIAGNOSES     ICD-9-CM ICD-10-CM   1. Abscess of left buttock 682.5 L02.31        Shanon Rosser, MD 03/07/16 [place], MD 03/07/16 [phone]

## 2016-03-07 NOTE — ED Triage Notes (Signed)
Pt reports abscess on buttock.

## 2016-05-15 DIAGNOSIS — N920 Excessive and frequent menstruation with regular cycle: Secondary | ICD-10-CM | POA: Diagnosis not present

## 2016-05-15 DIAGNOSIS — N760 Acute vaginitis: Secondary | ICD-10-CM | POA: Diagnosis not present

## 2016-05-15 DIAGNOSIS — Z720 Tobacco use: Secondary | ICD-10-CM | POA: Diagnosis not present

## 2016-06-13 DIAGNOSIS — D259 Leiomyoma of uterus, unspecified: Secondary | ICD-10-CM | POA: Diagnosis not present

## 2016-06-13 DIAGNOSIS — G43909 Migraine, unspecified, not intractable, without status migrainosus: Secondary | ICD-10-CM | POA: Diagnosis not present

## 2016-06-13 DIAGNOSIS — Z01818 Encounter for other preprocedural examination: Secondary | ICD-10-CM | POA: Diagnosis not present

## 2016-06-13 DIAGNOSIS — N920 Excessive and frequent menstruation with regular cycle: Secondary | ICD-10-CM | POA: Diagnosis not present

## 2016-06-13 DIAGNOSIS — N946 Dysmenorrhea, unspecified: Secondary | ICD-10-CM | POA: Diagnosis not present

## 2016-06-19 ENCOUNTER — Encounter (HOSPITAL_BASED_OUTPATIENT_CLINIC_OR_DEPARTMENT_OTHER): Payer: Self-pay

## 2016-06-19 ENCOUNTER — Emergency Department (HOSPITAL_BASED_OUTPATIENT_CLINIC_OR_DEPARTMENT_OTHER): Payer: 59

## 2016-06-19 ENCOUNTER — Emergency Department (HOSPITAL_BASED_OUTPATIENT_CLINIC_OR_DEPARTMENT_OTHER)
Admission: EM | Admit: 2016-06-19 | Discharge: 2016-06-19 | Disposition: A | Discharge status: Home / Self Care | Payer: 59 | Attending: Emergency Medicine | Admitting: Emergency Medicine

## 2016-06-19 DIAGNOSIS — Y929 Unspecified place or not applicable: Secondary | ICD-10-CM | POA: Diagnosis not present

## 2016-06-19 DIAGNOSIS — X501XXA Overexertion from prolonged static or awkward postures, initial encounter: Secondary | ICD-10-CM | POA: Diagnosis not present

## 2016-06-19 DIAGNOSIS — S63501A Unspecified sprain of right wrist, initial encounter: Secondary | ICD-10-CM | POA: Insufficient documentation

## 2016-06-19 DIAGNOSIS — Y990 Civilian activity done for income or pay: Secondary | ICD-10-CM | POA: Diagnosis not present

## 2016-06-19 DIAGNOSIS — M79641 Pain in right hand: Secondary | ICD-10-CM | POA: Diagnosis not present

## 2016-06-19 DIAGNOSIS — F17210 Nicotine dependence, cigarettes, uncomplicated: Secondary | ICD-10-CM | POA: Insufficient documentation

## 2016-06-19 DIAGNOSIS — Y939 Activity, unspecified: Secondary | ICD-10-CM | POA: Diagnosis not present

## 2016-06-19 DIAGNOSIS — S6991XA Unspecified injury of right wrist, hand and finger(s), initial encounter: Secondary | ICD-10-CM | POA: Diagnosis present

## 2016-06-19 NOTE — ED Notes (Signed)
Patient transported to X-ray 

## 2016-06-19 NOTE — ED Provider Notes (Signed)
[facility] Provider Note   CSN: [id number] Arrival date & time: 06/19/16  1433     History   Chief Complaint Chief Complaint  Patient presents with  . Arm Pain    HPI Shelby Moreno is a 46 y.o. female.  Patient is a 46 year old female with a history of back pains, migraine and prior injury to her right wrist presenting today with 2 weeks of right hand pain. 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 She injured her wrist last year which eventually got better on its own however last week she was moving an obese patient and felt a pop in her wrist and has had worsening pain. She then moved another patient last night and it made the pain worse. She describes the pain as sharp and knifelike radiating down to the fingers and up to the elbow. The pain is localized over the median portion of her wrist and goes into her thumb and 4 fingers. Pain is most pronounced in digits one through 3. She also has pain with movement of the wrist.  No falls or trauma. No swelling, redness or fevers.   The history is provided by the patient.    Past Medical History:  Diagnosis Date  . Back pain   . Migraine   . Vertigo     Patient Active Problem List   Diagnosis Date Noted  . Trigger finger, acquired 03/04/2016  . Right arm pain 11/24/2015  . Bilateral knee pain 03/15/2015  . Neck pain 01/04/2014  . Low back pain 01/04/2014    Past Surgical History:  Procedure Laterality Date  . ABLATION    . CESAREAN SECTION    . TUBAL LIGATION      OB History    No data available       Home Medications    Prior to Admission medications   Not on File    Family History No family history on file.  Social History Social History  Substance Use Topics  . Smoking status: Current Every Day Smoker    Packs/day: 0.50    Years: 27.00    Types: Cigarettes  . Smokeless tobacco: Never Used  . Alcohol use Yes     Comment: occ     Allergies   Augmentin  [amoxicillin-pot clavulanate]; Keflex [cephalexin]; Naproxen; Zithromax [azithromycin]; and Morphine and related   Review of Systems Review of Systems  All other systems reviewed and are negative.    Physical Exam Updated Vital Signs BP 116/83 (BP Location: Left Arm)   Pulse 88   Temp 98.2 F (36.8 C) (Oral)   Resp 17   Ht 5\' 7"  (1.702 m)   Wt 145 lb 14.4 oz (66.2 kg)   LMP 05/21/2016   SpO2 100%   BMI 22.85 kg/m   Physical Exam  Constitutional: She is oriented to person, place, and time. She appears well-developed and well-nourished. No distress.  HENT:  Head: Normocephalic.  Cardiovascular: Normal rate.   Pulmonary/Chest: Effort normal.  Musculoskeletal:       Hands: Neurological: She is alert and oriented to person, place, and time.  Skin: Skin is warm and dry. Capillary refill takes less than 2 seconds.  Psychiatric: She has a normal mood and affect.  Nursing note and vitals reviewed.    ED Treatments / Results  Labs (all labs ordered are listed, but only abnormal results are displayed) Labs Reviewed - No data to display  EKG  EKG Interpretation None  Radiology Dg Hand Complete Right  Result Date: 06/19/2016 CLINICAL DATA:  Diffuse right hand pain, numbness, and swelling. Recent injury. EXAM: RIGHT HAND - COMPLETE 3+ VIEW COMPARISON:  Right wrist radiographs 05/26/2014 FINDINGS: There is no evidence of fracture or dislocation. There is no evidence of arthropathy or other focal bone abnormality. Soft tissues are unremarkable. IMPRESSION: Negative. Electronically Signed   By: San Morelle M.D.   On: 06/19/2016 15:38    Procedures Procedures (including critical care time)  Medications Ordered in ED Medications - No data to display   Initial Impression / Assessment and Plan / ED Course  I have reviewed the triage vital signs and the nursing notes.  Pertinent labs & imaging results that were available during my care of the patient were  reviewed by me and considered in my medical decision making (see chart for details).     Patient with ongoing wrist pain that radiates into the fingers. Mild tingling and numbness also present. Patient is right handed. This occurred after injury at work. X-ray is negative. Patient does have snuffbox tenderness. Could be a sprain or tendinopathy. We'll place in a brace and given anti-inflammatories. Patient given follow-up.  Final Clinical Impressions(s) / ED Diagnoses   Final diagnoses:  Wrist sprain, right, initial encounter    New Prescriptions New Prescriptions   No medications on file     Blanchie Dessert, MD 06/19/16 1552

## 2016-06-19 NOTE — ED Triage Notes (Signed)
c/o pain to right arm after pulling a pt at work 2 weeks ago-NAD-steady gait

## 2016-06-24 ENCOUNTER — Ambulatory Visit: Payer: Self-pay | Admitting: Family Medicine

## 2016-06-26 ENCOUNTER — Encounter: Payer: Self-pay | Admitting: Family Medicine

## 2016-06-26 ENCOUNTER — Ambulatory Visit (INDEPENDENT_AMBULATORY_CARE_PROVIDER_SITE_OTHER): Payer: 59 | Admitting: Family Medicine

## 2016-06-26 DIAGNOSIS — M79601 Pain in right arm: Secondary | ICD-10-CM | POA: Diagnosis not present

## 2016-06-26 NOTE — Patient Instructions (Signed)
You have an extensor strain of your forearm (to a lesser extent flexor strain too). Icing 15 minutes at a time 3-4 times a day. Wrist brace to rest this - use as often as possible. Aleve 2 tabs twice a day with food for pain and inflammation. Continue restrictions. Follow up with me in 2 weeks.

## 2016-07-01 NOTE — Assessment & Plan Note (Signed)
injured when pulling patient up in bed.  Consistent with forearm strain (extensors more than flexors).  Reassured.  Continue work restrictions.  Icing, wrist brace, aleve.  F/u in 2 weeks.

## 2016-07-01 NOTE — Progress Notes (Signed)
PCP: Annitta Jersey., MD  Subjective:   HPI: Patient is a 46 y.o. female here for right arm injury.  Patient reports on 4/30 she was pulling a patient up in bed and felt a pull in her right forearm anteriorly. Some swelling and shooting into elbow. Pain up to 7/10 and sharp. Worse with wrist movements. Similar injury last June. Tried aleve, icing. Wearing wrist brace, on 5 pound restriction now. No skin changes, numbness.  Past Medical History:  Diagnosis Date  . Back pain   . Migraine   . Vertigo     No current outpatient prescriptions on file prior to visit.   No current facility-administered medications on file prior to visit.     Past Surgical History:  Procedure Laterality Date  . ABLATION    . CESAREAN SECTION    . TUBAL LIGATION      Allergies  Allergen Reactions  . Augmentin [Amoxicillin-Pot Clavulanate] Hives  . Keflex [Cephalexin] Hives and Other (See Comments)    Also respiratory depression  . Naproxen Hives  . Zithromax [Azithromycin] Hives  . Morphine And Related Itching    Has to take benadryl with it    Social History   Social History  . Marital status: Legally Separated    Spouse name: N/A  . Number of children: N/A  . Years of education: N/A   Occupational History  . Not on file.   Social History Main Topics  . Smoking status: Current Every Day Smoker    Packs/day: 0.50    Years: 27.00    Types: Cigarettes  . Smokeless tobacco: Never Used  . Alcohol use Yes     Comment: occ  . Drug use: No  . Sexual activity: Yes    Birth control/ protection: Surgical   Other Topics Concern  . Not on file   Social History Narrative  . No narrative on file    No family history on file.  BP 111/76   Pulse 72   Ht 5\' 6"  (1.676 m)   Wt 148 lb (67.1 kg)   BMI 23.89 kg/m   Review of Systems: See HPI above.     Objective:  Physical Exam:  Gen: NAD, comfortable in exam room  Right arm: No gross deformity, swelling, bruising. TTP  forearm laterally > medially.  No focal bony tenderness. Collateral ligaments intact of elbow. FROM wrist and digits - pain on resisted extension > flexion of wrist.  Mild pain extension digits. Negative tinels, phalens. NVI distally.  Left arm: FROM wrist and elbow without pain.   Assessment & Plan:  1. Right arm pain - injured when pulling patient up in bed.  Consistent with forearm strain (extensors more than flexors).  Reassured.  Continue work restrictions.  Icing, wrist brace, aleve.  F/u in 2 weeks.

## 2016-07-03 DIAGNOSIS — E7212 Methylenetetrahydrofolate reductase deficiency: Secondary | ICD-10-CM | POA: Diagnosis not present

## 2016-07-08 DIAGNOSIS — Z79899 Other long term (current) drug therapy: Secondary | ICD-10-CM | POA: Diagnosis not present

## 2016-07-08 DIAGNOSIS — N946 Dysmenorrhea, unspecified: Secondary | ICD-10-CM | POA: Diagnosis not present

## 2016-07-08 DIAGNOSIS — D259 Leiomyoma of uterus, unspecified: Secondary | ICD-10-CM | POA: Diagnosis not present

## 2016-07-08 DIAGNOSIS — F17211 Nicotine dependence, cigarettes, in remission: Secondary | ICD-10-CM | POA: Diagnosis not present

## 2016-07-08 DIAGNOSIS — N920 Excessive and frequent menstruation with regular cycle: Secondary | ICD-10-CM | POA: Diagnosis not present

## 2016-07-08 DIAGNOSIS — D251 Intramural leiomyoma of uterus: Secondary | ICD-10-CM | POA: Diagnosis not present

## 2016-07-08 DIAGNOSIS — F419 Anxiety disorder, unspecified: Secondary | ICD-10-CM | POA: Diagnosis not present

## 2016-07-09 DIAGNOSIS — F17211 Nicotine dependence, cigarettes, in remission: Secondary | ICD-10-CM | POA: Diagnosis not present

## 2016-07-09 DIAGNOSIS — D251 Intramural leiomyoma of uterus: Secondary | ICD-10-CM | POA: Diagnosis not present

## 2016-07-09 DIAGNOSIS — F419 Anxiety disorder, unspecified: Secondary | ICD-10-CM | POA: Diagnosis not present

## 2016-07-09 DIAGNOSIS — Z79899 Other long term (current) drug therapy: Secondary | ICD-10-CM | POA: Diagnosis not present

## 2016-07-21 DIAGNOSIS — R102 Pelvic and perineal pain: Secondary | ICD-10-CM | POA: Diagnosis not present

## 2016-08-05 ENCOUNTER — Encounter (INDEPENDENT_AMBULATORY_CARE_PROVIDER_SITE_OTHER): Payer: Self-pay

## 2016-08-05 ENCOUNTER — Encounter: Payer: Self-pay | Admitting: Family Medicine

## 2016-08-05 ENCOUNTER — Ambulatory Visit (INDEPENDENT_AMBULATORY_CARE_PROVIDER_SITE_OTHER): Payer: 59 | Admitting: Family Medicine

## 2016-08-05 DIAGNOSIS — M79601 Pain in right arm: Secondary | ICD-10-CM

## 2016-08-05 MED ORDER — NITROGLYCERIN 0.2 MG/HR TD PT24: MEDICATED_PATCH | TRANSDERMAL | 1 refills | Status: DC

## 2016-08-05 NOTE — Patient Instructions (Signed)
You have an extensor strain of your forearm and tennis elbow. Icing 15 minutes at a time 3-4 times a day. Wrist brace to rest this - use as often as possible. Aleve 2 tabs twice a day with food for pain and inflammation. Nitro patches 1/4th patch over the elbow, change daily. Follow up with me in 6 weeks for reevaluation but call me if you don't tolerate the nitro. If you don't would consider MRI of your elbow.

## 2016-08-06 DIAGNOSIS — N760 Acute vaginitis: Secondary | ICD-10-CM | POA: Diagnosis not present

## 2016-08-06 DIAGNOSIS — Z90710 Acquired absence of both cervix and uterus: Secondary | ICD-10-CM | POA: Diagnosis not present

## 2016-08-06 NOTE — Progress Notes (Signed)
PCP: Annitta Jersey., MD  Subjective:   HPI: Patient is a 46 y.o. female here for right arm injury.  5/17: Patient reports on 4/30 she was pulling a patient up in bed and felt a pull in her right forearm anteriorly. Some swelling and shooting into elbow. Pain up to 7/10 and sharp. Worse with wrist movements. Similar injury last June. Tried aleve, icing. Wearing wrist brace, on 5 pound restriction now. No skin changes, numbness.  6/26: Patient reports she has not improved from last visit.   Past Medical History:  Diagnosis Date  . Back pain   . Migraine   . Vertigo     Current Outpatient Prescriptions on File Prior to Visit  Medication Sig Dispense Refill  . NICOTINE STEP 2 14 MG/24HR patch UNW AND APP 1 PA TO SKIN D  1   No current facility-administered medications on file prior to visit.     Past Surgical History:  Procedure Laterality Date  . ABLATION    . CESAREAN SECTION    . TUBAL LIGATION      Allergies  Allergen Reactions  . Augmentin [Amoxicillin-Pot Clavulanate] Hives  . Keflex [Cephalexin] Hives and Other (See Comments)    Also respiratory depression  . Naproxen Hives  . Zithromax [Azithromycin] Hives  . Morphine And Related Itching    Has to take benadryl with it    Social History   Social History  . Marital status: Legally Separated    Spouse name: N/A  . Number of children: N/A  . Years of education: N/A   Occupational History  . Not on file.   Social History Main Topics  . Smoking status: Current Every Day Smoker    Packs/day: 0.50    Years: 27.00    Types: Cigarettes  . Smokeless tobacco: Never Used  . Alcohol use Yes     Comment: occ  . Drug use: No  . Sexual activity: Yes    Birth control/ protection: Surgical   Other Topics Concern  . Not on file   Social History Narrative  . No narrative on file    No family history on file.  BP 112/77   Pulse 93   Ht 5\' 6"  (1.676 m)   Wt 148 lb (67.1 kg)   BMI 23.89 kg/m    Review of Systems: See HPI above.     Objective:  Physical Exam:  Gen: NAD, comfortable in exam room  Right arm: No gross deformity, swelling, bruising. TTP forearm laterally, extensor tendons as they cross wrist.  No focal bony tenderness. Collateral ligaments intact of elbow. FROM wrist and digits - pain on resisted extension > flexion of wrist.  Mild pain extension digits also. Negative tinels. NVI distally.  Left arm: FROM wrist and elbow without pain.   Assessment & Plan:  1. Right arm pain - injured when pulling patient up in bed.  Has fairly diffuse pain below elbow but mostly in extensors.  Consistent with forearm strain.  She declined occupational therapy.  Will continue wrist brace.  Icing, aleve.  Trial nitro patches - discussed risks of headache, skin irritation.  Consider MRI of elbow if struggling.  F/u in 6 weeks.

## 2016-08-06 NOTE — Assessment & Plan Note (Signed)
injured when pulling patient up in bed.  Has fairly diffuse pain below elbow but mostly in extensors.  Consistent with forearm strain.  She declined occupational therapy.  Will continue wrist brace.  Icing, aleve.  Trial nitro patches - discussed risks of headache, skin irritation.  Consider MRI of elbow if struggling.  F/u in 6 weeks.

## 2016-08-08 DIAGNOSIS — L853 Xerosis cutis: Secondary | ICD-10-CM | POA: Diagnosis not present

## 2016-08-08 DIAGNOSIS — L301 Dyshidrosis [pompholyx]: Secondary | ICD-10-CM | POA: Diagnosis not present

## 2016-08-08 DIAGNOSIS — L299 Pruritus, unspecified: Secondary | ICD-10-CM | POA: Diagnosis not present

## 2016-08-20 DIAGNOSIS — Z7689 Persons encountering health services in other specified circumstances: Secondary | ICD-10-CM | POA: Diagnosis not present

## 2016-08-20 DIAGNOSIS — N946 Dysmenorrhea, unspecified: Secondary | ICD-10-CM | POA: Diagnosis not present

## 2016-08-25 ENCOUNTER — Ambulatory Visit: Payer: Self-pay | Admitting: Family Medicine

## 2016-08-26 ENCOUNTER — Ambulatory Visit: Payer: Self-pay | Admitting: Family Medicine

## 2016-09-04 ENCOUNTER — Encounter: Payer: Self-pay | Admitting: Family Medicine

## 2016-09-04 ENCOUNTER — Ambulatory Visit (INDEPENDENT_AMBULATORY_CARE_PROVIDER_SITE_OTHER): Payer: 59 | Admitting: Family Medicine

## 2016-09-04 DIAGNOSIS — M79601 Pain in right arm: Secondary | ICD-10-CM

## 2016-09-04 DIAGNOSIS — Z9889 Other specified postprocedural states: Secondary | ICD-10-CM | POA: Diagnosis not present

## 2016-09-04 NOTE — Patient Instructions (Signed)
This is still most consistent with strain of muscles around the elbow. However given how long this has been bothering you without improvement we will go ahead with MRI to assess for osteochondral defect, other abnormalities. Icing 15 minutes at a time 3-4 times a day. Wrist brace to rest this - use as often as possible. Aleve 2 tabs twice a day with food for pain and inflammation. Follow up will depend on the MRI results.

## 2016-09-05 NOTE — Progress Notes (Addendum)
PCP: Annitta Jersey., MD  Subjective:   HPI: Patient is a 46 y.o. female here for right arm injury.  5/17: Patient reports on 4/30 she was pulling a patient up in bed and felt a pull in her right forearm anteriorly. Some swelling and shooting into elbow. Pain up to 7/10 and sharp. Worse with wrist movements. Similar injury last June. Tried aleve, icing. Wearing wrist brace, on 5 pound restriction now. No skin changes, numbness.  6/26: Patient reports she has not improved from last visit.  7/26: Patient reports continued pain right elbow down to wrist. Pain level 5/10 and sharp. Worse with movements, picking up items, holding things. Using wrist brace. Nitro patches caused headaches. Some swelling at times. No skin changes, numbness.  Past Medical History:  Diagnosis Date  . Back pain   . Migraine   . Vertigo     Current Outpatient Prescriptions on File Prior to Visit  Medication Sig Dispense Refill  . NICOTINE STEP 2 14 MG/24HR patch UNW AND APP 1 PA TO SKIN D  1  . nitroGLYCERIN (NITRODUR - DOSED IN MG/24 HR) 0.2 mg/hr patch Apply 1/4th patch to affected elbow, change daily 30 patch 1   No current facility-administered medications on file prior to visit.     Past Surgical History:  Procedure Laterality Date  . ABLATION    . CESAREAN SECTION    . TUBAL LIGATION      Allergies  Allergen Reactions  . Augmentin [Amoxicillin-Pot Clavulanate] Hives  . Keflex [Cephalexin] Hives and Other (See Comments)    Also respiratory depression  . Naproxen Hives  . Zithromax [Azithromycin] Hives  . Morphine And Related Itching    Has to take benadryl with it    Social History   Social History  . Marital status: Legally Separated    Spouse name: N/A  . Number of children: N/A  . Years of education: N/A   Occupational History  . Not on file.   Social History Main Topics  . Smoking status: Current Every Day Smoker    Packs/day: 0.50    Years: 27.00    Types:  Cigarettes  . Smokeless tobacco: Never Used  . Alcohol use Yes     Comment: occ  . Drug use: No  . Sexual activity: Yes    Birth control/ protection: Surgical   Other Topics Concern  . Not on file   Social History Narrative  . No narrative on file    No family history on file.  BP 118/74   Pulse 85   Ht 5\' 6"  (1.676 m)   Wt 148 lb (67.1 kg)   BMI 23.89 kg/m   Review of Systems: See HPI above.     Objective:  Physical Exam:  Gen: NAD, comfortable in exam room  Right arm/elbow No gross deformity, swelling, bruising. TTP lateral epicondyle, less medial epicondyle and into extensors of forearm.  No focal bony tenderness. Collateral ligaments intact of elbow. FROM wrist and digits - pain on resisted extension > flexion of wrist.   Negative tinels. NVI distally.  Left arm: FROM wrist and elbow without pain.   Assessment & Plan:  1. Right elbow/arm pain - Still suspect muscle strain around elbow but not improving after 3 months of medications, icing, bracing, home exercises.  Will go ahead with MRI to assess for OCD, other abnormalities.  Had headaches with nitro so stopped.  Aleve as needed.  Addendum:  MRI reviewed and discussed with patient.  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  She declined therapy as has aggravated issues in the past.  Encouraged continued bracing, consider elbow sleeve as well.  She has some meloxicam and will try this instead of aleve.

## 2016-09-05 NOTE — Assessment & Plan Note (Signed)
Still suspect muscle strain around elbow but not improving after 3 months of medications, icing, bracing, home exercises.  Will go ahead with MRI to assess for OCD, other abnormalities.  Had headaches with nitro so stopped.  Aleve as needed.

## 2016-09-06 ENCOUNTER — Ambulatory Visit (HOSPITAL_BASED_OUTPATIENT_CLINIC_OR_DEPARTMENT_OTHER)
Admission: RE | Admit: 2016-09-06 | Discharge: 2016-09-06 | Disposition: A | Discharge status: Home / Self Care | Payer: 59 | Source: Ambulatory Visit | Attending: Family Medicine | Admitting: Family Medicine

## 2016-09-06 DIAGNOSIS — M25521 Pain in right elbow: Secondary | ICD-10-CM | POA: Diagnosis not present

## 2016-09-06 DIAGNOSIS — M79601 Pain in right arm: Secondary | ICD-10-CM | POA: Insufficient documentation

## 2016-09-09 ENCOUNTER — Encounter (HOSPITAL_BASED_OUTPATIENT_CLINIC_OR_DEPARTMENT_OTHER): Payer: Self-pay

## 2016-09-09 DIAGNOSIS — F17210 Nicotine dependence, cigarettes, uncomplicated: Secondary | ICD-10-CM | POA: Insufficient documentation

## 2016-09-09 DIAGNOSIS — H5711 Ocular pain, right eye: Secondary | ICD-10-CM | POA: Diagnosis present

## 2016-09-09 DIAGNOSIS — H10021 Other mucopurulent conjunctivitis, right eye: Secondary | ICD-10-CM | POA: Insufficient documentation

## 2016-09-09 DIAGNOSIS — H1031 Unspecified acute conjunctivitis, right eye: Secondary | ICD-10-CM | POA: Diagnosis not present

## 2016-09-09 NOTE — ED Triage Notes (Signed)
Pt c/o right eye swelling, burning and drainage since this morning

## 2016-09-10 ENCOUNTER — Emergency Department (HOSPITAL_BASED_OUTPATIENT_CLINIC_OR_DEPARTMENT_OTHER)
Admission: EM | Admit: 2016-09-10 | Discharge: 2016-09-10 | Disposition: A | Discharge status: Home / Self Care | Payer: 59 | Attending: Emergency Medicine | Admitting: Emergency Medicine

## 2016-09-10 DIAGNOSIS — H1031 Unspecified acute conjunctivitis, right eye: Secondary | ICD-10-CM

## 2016-09-10 DIAGNOSIS — H10021 Other mucopurulent conjunctivitis, right eye: Secondary | ICD-10-CM | POA: Diagnosis not present

## 2016-09-10 DIAGNOSIS — F17210 Nicotine dependence, cigarettes, uncomplicated: Secondary | ICD-10-CM | POA: Diagnosis not present

## 2016-09-10 MED ORDER — CIPROFLOXACIN HCL 0.3 % OP SOLN: 1.0000 [drp] | Freq: Four times a day (QID) | OPHTHALMIC | 0 refills | Status: DC

## 2016-09-10 MED ORDER — TETRACAINE HCL 0.5 % OP SOLN
1.0000 [drp] | Freq: Once | OPHTHALMIC | Status: AC
  Administered 2016-09-10: 1 [drp] via OPHTHALMIC
  Filled 2016-09-10: qty 4

## 2016-09-10 MED ORDER — FLUORESCEIN SODIUM 0.6 MG OP STRP
1.0000 | ORAL_STRIP | Freq: Once | OPHTHALMIC | Status: AC
  Administered 2016-09-10: 1 via OPHTHALMIC
  Filled 2016-09-10: qty 1

## 2016-09-10 NOTE — ED Provider Notes (Signed)
TIME SEEN: 1:59 AM  CHIEF COMPLAINT: Right eye pain and discharge  HPI: Patient is a 46 year old female with history of migraines who presents emergency department with right eye pain and purulent discharge for the past day. Patient works as a Chartered certified accountant at [employer]. She is not aware of anybody who has had conjunctivitis around her. No injury to the eye. She does were contacts chronically and has taken the contact out of this side. Because of this her vision is very blurry in the right side. She is wearing a contact currently in the left eye. No significant headache or vomiting. She does not use eyedrops regularly and no history of any other eye conditions. No vision loss.  ROS: See HPI Constitutional: no fever  Eyes: no drainage  ENT: no runny nose   Cardiovascular:  no chest pain  Resp: no SOB  GI: no vomiting GU: no dysuria Integumentary: no rash  Allergy: no hives  Musculoskeletal: no leg swelling  Neurological: no slurred speech ROS otherwise negative  PAST MEDICAL HISTORY/PAST SURGICAL HISTORY:  Past Medical History:  Diagnosis Date  . Back pain   . Migraine   . Vertigo     MEDICATIONS:  Prior to Admission medications   Medication Sig Start Date End Date Taking? Authorizing Provider  NICOTINE STEP 2 14 MG/24HR patch UNW AND APP 1 PA TO SKIN D 06/13/16   [provider]  nitroGLYCERIN (NITRODUR - DOSED IN MG/24 HR) 0.2 mg/hr patch Apply 1/4th patch to affected elbow, change daily 08/05/16   Hudnall, Sharyn Lull, MD    ALLERGIES:  Allergies  Allergen Reactions  . Augmentin [Amoxicillin-Pot Clavulanate] Hives  . Keflex [Cephalexin] Hives and Other (See Comments)    Also respiratory depression  . Naproxen Hives  . Zithromax [Azithromycin] Hives  . Morphine And Related Itching    Has to take benadryl with it    SOCIAL HISTORY:  Social History  Substance Use Topics  . Smoking status: Current Every Day Smoker    Packs/day: 0.50    Years: 27.00   Types: Cigarettes  . Smokeless tobacco: Never Used  . Alcohol use Yes     Comment: occ    FAMILY HISTORY: No family history on file.  EXAM: BP 125/88 (BP Location: Right Arm)   Pulse 75   Temp 98.5 F (36.9 C) (Oral)   Resp 18   Ht 5\' 6"  (1.676 m)   Wt 67.1 kg (148 lb)   LMP 05/21/2016   SpO2 100%   BMI 23.89 kg/m  CONSTITUTIONAL: Alert and oriented and responds appropriately to questions. Well-appearing; well-nourished HEAD: Normocephalic EYES: Conjunctivae clear On the left, pupils appear equal, EOMI, right conjunctiva is injected with purulent drainage, no corneal abrasions or ulceration noted to the right eye, normal funduscopic exam bilaterally, intraocular pressure of the right eye is 12 mmHg, patient has very blurry vision in the right eye which she reports is normal for her when there is no contact in place and she did not bring any glasses with her; no pain with consensual light response, no photophobia ENT: normal nose; moist mucous membranes NECK: Supple, no meningismus, no nuchal rigidity, no LAD  CARD: RRR; S1 and S2 appreciated; no murmurs, no clicks, no rubs, no gallops RESP: Normal chest excursion without splinting or tachypnea; breath sounds clear and equal bilaterally; no wheezes, no rhonchi, no rales, no hypoxia or respiratory distress, speaking full sentences ABD/GI: Normal bowel sounds; non-distended; soft, non-tender, no rebound, no guarding, no  peritoneal signs, no hepatosplenomegaly BACK:  The back appears normal and is non-tender to palpation, there is no CVA tenderness EXT: Normal ROM in all joints; non-tender to palpation; no edema; normal capillary refill; no cyanosis, no calf tenderness or swelling    SKIN: Normal color for age and race; warm; no rash NEURO: Moves all extremities equally PSYCH: The patient's mood and manner are appropriate. Grooming and personal hygiene are appropriate.  MEDICAL DECISION MAKING: Patient here with acute bacterial  conjunctivitis. Her globe appears intact. She is normal intraocular pressures are normal funduscopic exam. No sign of corneal ulceration or abrasion. No sign of glaucoma.  We'll put her on Cipro drops given she wears contacts and have advised her to not use her contacts until her symptoms have completely resolved or at least for one week. Recommend she wear glasses instead. Have given her ophthalmology follow-up. Provided with work note given I feel she is contagious to her patients. Discussed return precautions. Patient verbalizes understanding and is comfortable with this plan.  At this time, I do not feel there is any life-threatening condition present. I have reviewed and discussed all results (EKG, imaging, lab, urine as appropriate) and exam findings with patient/family. I have reviewed nursing notes and appropriate previous records.  I feel the patient is safe to be discharged home without further emergent workup and can continue workup as an outpatient as needed. Discussed usual and customary return precautions. Patient/family verbalize understanding and are comfortable with this plan.  Outpatient follow-up has been provided if needed. All questions have been answered.      Olar Santini, Delice Bison, DO 09/10/16 [phone]

## 2016-10-30 ENCOUNTER — Encounter (HOSPITAL_BASED_OUTPATIENT_CLINIC_OR_DEPARTMENT_OTHER): Payer: Self-pay | Admitting: *Deleted

## 2016-10-30 ENCOUNTER — Emergency Department (HOSPITAL_BASED_OUTPATIENT_CLINIC_OR_DEPARTMENT_OTHER): Payer: 59

## 2016-10-30 ENCOUNTER — Emergency Department (HOSPITAL_BASED_OUTPATIENT_CLINIC_OR_DEPARTMENT_OTHER)
Admission: EM | Admit: 2016-10-30 | Discharge: 2016-10-30 | Disposition: A | Discharge status: Home / Self Care | Payer: 59 | Attending: Emergency Medicine | Admitting: Emergency Medicine

## 2016-10-30 DIAGNOSIS — F17210 Nicotine dependence, cigarettes, uncomplicated: Secondary | ICD-10-CM | POA: Insufficient documentation

## 2016-10-30 DIAGNOSIS — Z79899 Other long term (current) drug therapy: Secondary | ICD-10-CM | POA: Diagnosis not present

## 2016-10-30 DIAGNOSIS — R1031 Right lower quadrant pain: Secondary | ICD-10-CM | POA: Diagnosis not present

## 2016-10-30 DIAGNOSIS — K2100 Gastro-esophageal reflux disease with esophagitis, without bleeding: Secondary | ICD-10-CM

## 2016-10-30 DIAGNOSIS — R1013 Epigastric pain: Secondary | ICD-10-CM | POA: Diagnosis present

## 2016-10-30 LAB — CBC WITH DIFFERENTIAL/PLATELET
BASOS ABS: 0 10*3/uL (ref 0.0–0.1)
BASOS PCT: 0 %
EOS ABS: 0.6 10*3/uL (ref 0.0–0.7)
EOS PCT: 5 %
HCT: 39.9 % (ref 36.0–46.0)
Hemoglobin: 13.8 g/dL (ref 12.0–15.0)
Lymphocytes Relative: 35 %
Lymphs Abs: 4.5 10*3/uL — ABNORMAL HIGH (ref 0.7–4.0)
MCH: 30.9 pg (ref 26.0–34.0)
MCHC: 34.6 g/dL (ref 30.0–36.0)
MCV: 89.5 fL (ref 78.0–100.0)
MONO ABS: 1 10*3/uL (ref 0.1–1.0)
MONOS PCT: 8 %
NEUTROS ABS: 6.7 10*3/uL (ref 1.7–7.7)
Neutrophils Relative %: 52 %
PLATELETS: 221 10*3/uL (ref 150–400)
RBC: 4.46 MIL/uL (ref 3.87–5.11)
RDW: 14.5 % (ref 11.5–15.5)
WBC: 12.8 10*3/uL — ABNORMAL HIGH (ref 4.0–10.5)

## 2016-10-30 LAB — URINALYSIS, ROUTINE W REFLEX MICROSCOPIC
Bilirubin Urine: NEGATIVE
GLUCOSE, UA: NEGATIVE mg/dL
Ketones, ur: NEGATIVE mg/dL
LEUKOCYTES UA: NEGATIVE
NITRITE: NEGATIVE
PROTEIN: NEGATIVE mg/dL
Specific Gravity, Urine: 1.02 (ref 1.005–1.030)
pH: 6 (ref 5.0–8.0)

## 2016-10-30 LAB — COMPREHENSIVE METABOLIC PANEL
ALBUMIN: 4.1 g/dL (ref 3.5–5.0)
ALT: 12 U/L — ABNORMAL LOW (ref 14–54)
ANION GAP: 6 (ref 5–15)
AST: 13 U/L — ABNORMAL LOW (ref 15–41)
Alkaline Phosphatase: 70 U/L (ref 38–126)
BUN: 13 mg/dL (ref 6–20)
CHLORIDE: 103 mmol/L (ref 101–111)
CO2: 30 mmol/L (ref 22–32)
Calcium: 9.7 mg/dL (ref 8.9–10.3)
Creatinine, Ser: 0.81 mg/dL (ref 0.44–1.00)
GFR calc Af Amer: >60 mL/min (ref >60)
GFR calc non Af Amer: >60 mL/min (ref >60)
Glucose, Bld: 109 mg/dL — ABNORMAL HIGH (ref 65–99)
POTASSIUM: 3.4 mmol/L — AB (ref 3.5–5.1)
SODIUM: 139 mmol/L (ref 135–145)
TOTAL PROTEIN: 7.7 g/dL (ref 6.5–8.1)
Total Bilirubin: 0.5 mg/dL (ref 0.3–1.2)

## 2016-10-30 LAB — URINALYSIS, MICROSCOPIC (REFLEX)

## 2016-10-30 LAB — LIPASE, BLOOD: LIPASE: 32 U/L (ref 11–51)

## 2016-10-30 MED ORDER — SUCRALFATE 1 GM/10ML PO SUSP: 1.0000 g | Freq: Three times a day (TID) | ORAL | 0 refills | Status: DC

## 2016-10-30 MED ORDER — GI COCKTAIL ~~LOC~~
30.0000 mL | Freq: Once | ORAL | Status: AC
  Administered 2016-10-30: 30 mL via ORAL
  Filled 2016-10-30: qty 30

## 2016-10-30 MED ORDER — KETOROLAC TROMETHAMINE 30 MG/ML IJ SOLN: 15.0000 mg | Freq: Once | INTRAMUSCULAR | Status: DC

## 2016-10-30 MED ORDER — SUCRALFATE 1 GM/10ML PO SUSP
ORAL | Status: AC
  Filled 2016-10-30: qty 10

## 2016-10-30 MED ORDER — PANTOPRAZOLE SODIUM 20 MG PO TBEC: 20.0000 mg | DELAYED_RELEASE_TABLET | Freq: Every day | ORAL | 0 refills | Status: DC

## 2016-10-30 MED ORDER — DIPHENHYDRAMINE HCL 50 MG/ML IJ SOLN: 25.0000 mg | Freq: Once | INTRAMUSCULAR | Status: DC

## 2016-10-30 MED ORDER — IOPAMIDOL (ISOVUE-300) INJECTION 61%
100.0000 mL | Freq: Once | INTRAVENOUS | Status: AC | PRN
  Administered 2016-10-30: 100 mL via INTRAVENOUS

## 2016-10-30 MED ORDER — DICYCLOMINE HCL 10 MG/ML IM SOLN
20.0000 mg | Freq: Once | INTRAMUSCULAR | Status: AC
  Administered 2016-10-30: 20 mg via INTRAMUSCULAR
  Filled 2016-10-30: qty 2

## 2016-10-30 NOTE — ED Triage Notes (Addendum)
Pt c/o right upper abd pain  X 8 hrs, denies n/v/d, pt states she needs pain medication like morphine but she needs benadryl with it

## 2016-10-30 NOTE — ED Provider Notes (Signed)
[facility] Provider Note   CSN: [id number] Arrival date & time: 10/30/16  0107     History   Chief Complaint Chief Complaint  Patient presents with  . Abdominal Pain    HPI Shelby Moreno is a 46 y.o. female.  The history is provided by the patient.  Abdominal Pain   This is a new problem. The current episode started 12 to 24 hours ago. The problem occurs constantly. The problem has not changed since onset.The pain is associated with an unknown factor. The pain is located in the epigastric region. The quality of the pain is cramping. The pain is severe. Associated symptoms include constipation. Pertinent negatives include anorexia, fever, belching, flatus, hematochezia, melena and vomiting. Nothing aggravates the symptoms. Nothing relieves the symptoms. Past workup does not include barium enema. Her past medical history does not include gallstones.    Past Medical History:  Diagnosis Date  . Back pain   . Migraine   . Vertigo     Patient Active Problem List   Diagnosis Date Noted  . Trigger finger, acquired 03/04/2016  . Right arm pain 11/24/2015  . Bilateral knee pain 03/15/2015  . Neck pain 01/04/2014  . Low back pain 01/04/2014    Past Surgical History:  Procedure Laterality Date  . ABDOMINAL HYSTERECTOMY    . ABLATION    . CESAREAN SECTION    . TUBAL LIGATION      OB History    No data available       Home Medications    Prior to Admission medications   Medication Sig Start Date End Date Taking? Authorizing Provider  nitroGLYCERIN (NITRODUR - DOSED IN MG/24 HR) 0.2 mg/hr patch Apply 1/4th patch to affected elbow, change daily 08/05/16   Hudnall, Sharyn Lull, MD    Family History History reviewed. No pertinent family history.  Social History Social History  Substance Use Topics  . Smoking status: Current Every Day Smoker    Packs/day: 0.50    Years: 27.00    Types: Cigarettes  . Smokeless tobacco: Never Used  . Alcohol use Yes   Comment: occ     Allergies   Augmentin [amoxicillin-pot clavulanate]; Keflex [cephalexin]; Naproxen; Zithromax [azithromycin]; and Morphine and related   Review of Systems Review of Systems  Constitutional: Negative for appetite change and fever.  Gastrointestinal: Positive for abdominal pain and constipation. Negative for anorexia, flatus, hematochezia, melena and vomiting.  All other systems reviewed and are negative.    Physical Exam Updated Vital Signs BP 132/87   Pulse 85   Temp 98.3 F (36.8 C)   Resp 18   Ht 5\' 6"  (1.676 m)   Wt 65.8 kg (145 lb)   LMP 05/21/2016   SpO2 100%   BMI 23.40 kg/m   Physical Exam  Constitutional: She is oriented to person, place, and time. She appears well-developed and well-nourished. No distress.  HENT:  Head: Normocephalic and atraumatic.  Mouth/Throat: No oropharyngeal exudate.  Eyes: Pupils are equal, round, and reactive to light. Conjunctivae are normal.  Neck: Normal range of motion. Neck supple.  Cardiovascular: Normal rate, regular rhythm, normal heart sounds and intact distal pulses.   Pulmonary/Chest: Effort normal and breath sounds normal. She has no wheezes. She has no rales.  Abdominal: Soft. Bowel sounds are normal. She exhibits no mass. There is no tenderness. There is no rebound and no guarding.  Musculoskeletal: Normal range of motion.  Neurological: She is alert and oriented to person, place, and  time. She displays normal reflexes.  Skin: Skin is warm and dry. Capillary refill takes less than 2 seconds.  Psychiatric: She has a normal mood and affect.     ED Treatments / Results   Vitals:   10/30/16 0114  BP: 132/87  Pulse: 85  Resp: 18  Temp: 98.3 F (36.8 C)  SpO2: 100%    Labs (all labs ordered are listed, but only abnormal results are displayed)  Results for orders placed or performed during the hospital encounter of 10/30/16  Urinalysis, Routine w reflex microscopic  Result Value Ref Range    Color, Urine YELLOW YELLOW   APPearance CLEAR CLEAR   Specific Gravity, Urine 1.020 1.005 - 1.030   pH 6.0 5.0 - 8.0   Glucose, UA NEGATIVE NEGATIVE mg/dL   Hgb urine dipstick TRACE (A) NEGATIVE   Bilirubin Urine NEGATIVE NEGATIVE   Ketones, ur NEGATIVE NEGATIVE mg/dL   Protein, ur NEGATIVE NEGATIVE mg/dL   Nitrite NEGATIVE NEGATIVE   Leukocytes, UA NEGATIVE NEGATIVE  Urinalysis, Microscopic (reflex)  Result Value Ref Range   RBC / HPF 0-5 0 - 5 RBC/hpf   WBC, UA 0-5 0 - 5 WBC/hpf   Bacteria, UA RARE (A) NONE SEEN   Squamous Epithelial / LPF 6-30 (A) NONE SEEN  CBC with Differential/Platelet  Result Value Ref Range   WBC 12.8 (H) 4.0 - 10.5 K/uL   RBC 4.46 3.87 - 5.11 MIL/uL   Hemoglobin 13.8 12.0 - 15.0 g/dL   HCT 39.9 36.0 - 46.0 %   MCV 89.5 78.0 - 100.0 fL   MCH 30.9 26.0 - 34.0 pg   MCHC 34.6 30.0 - 36.0 g/dL   RDW 14.5 11.5 - 15.5 %   Platelets 221 150 - 400 K/uL   Neutrophils Relative % 52 %   Neutro Abs 6.7 1.7 - 7.7 K/uL   Lymphocytes Relative 35 %   Lymphs Abs 4.5 (H) 0.7 - 4.0 K/uL   Monocytes Relative 8 %   Monocytes Absolute 1.0 0.1 - 1.0 K/uL   Eosinophils Relative 5 %   Eosinophils Absolute 0.6 0.0 - 0.7 K/uL   Basophils Relative 0 %   Basophils Absolute 0.0 0.0 - 0.1 K/uL  Comprehensive metabolic panel  Result Value Ref Range   Sodium 139 135 - 145 mmol/L   Potassium 3.4 (L) 3.5 - 5.1 mmol/L   Chloride 103 101 - 111 mmol/L   CO2 30 22 - 32 mmol/L   Glucose, Bld 109 (H) 65 - 99 mg/dL   BUN 13 6 - 20 mg/dL   Creatinine, Ser 0.81 0.44 - 1.00 mg/dL   Calcium 9.7 8.9 - 10.3 mg/dL   Total Protein 7.7 6.5 - 8.1 g/dL   Albumin 4.1 3.5 - 5.0 g/dL   AST 13 (L) 15 - 41 U/L   ALT 12 (L) 14 - 54 U/L   Alkaline Phosphatase 70 38 - 126 U/L   Total Bilirubin 0.5 0.3 - 1.2 mg/dL   GFR calc non Af Amer >60 >60 mL/min   GFR calc Af Amer >60 >60 mL/min   Anion gap 6 5 - 15  Lipase, blood  Result Value Ref Range   Lipase 32 11 - 51 U/L   No results  found.  EKG  EKG Interpretation None       Radiology No results found.  Procedures Procedures (including critical care time)  Medications Ordered in ED Medications  dicyclomine (BENTYL) injection 20 mg (20 mg Intramuscular Given 10/30/16 0253)  gi cocktail (Maalox,Lidocaine,Donnatal) (30 mLs Oral Given 10/30/16 0253)  iopamidol (ISOVUE-300) 61 % injection 100 mL (100 mLs Intravenous Contrast Given 10/30/16 0426)      Final Clinical Impressions(s) / ED Diagnoses   Strict return precautions given for  chest pain, dyspnea on exertion, new weakness or numbness changes in vision or speech,  Inability to tolerate liquids or food, changes in voice cough, altered mental status or any concerns. No signs of systemic illness or infection. The patient is nontoxic-appearing on exam and vital signs are within normal limits.    I have reviewed the triage vital signs and the nursing notes. Pertinent labs &imaging results that were available during my care of the patient were reviewed by me and considered in my medical decision making (see chart for details).  After history, exam, and medical workup I feel the patient has been appropriately medically screened and is safe for discharge home. Pertinent diagnoses were discussed with the patient. Patient was given return precautions.     Leverett Camplin, MD 10/30/16 [phone]

## 2016-10-30 NOTE — ED Notes (Signed)
ED Provider at bedside. 

## 2016-11-05 ENCOUNTER — Encounter: Payer: Self-pay | Admitting: Gastroenterology

## 2016-11-12 DIAGNOSIS — Z720 Tobacco use: Secondary | ICD-10-CM | POA: Diagnosis not present

## 2016-11-12 DIAGNOSIS — J069 Acute upper respiratory infection, unspecified: Secondary | ICD-10-CM | POA: Diagnosis not present

## 2016-12-30 ENCOUNTER — Encounter: Payer: Self-pay | Admitting: Gastroenterology

## 2016-12-30 ENCOUNTER — Ambulatory Visit (INDEPENDENT_AMBULATORY_CARE_PROVIDER_SITE_OTHER): Payer: 59 | Admitting: Gastroenterology

## 2016-12-30 VITALS — BP 104/70 | HR 76 | Ht 66.0 in | Wt 147.8 lb

## 2016-12-30 DIAGNOSIS — K582 Mixed irritable bowel syndrome: Secondary | ICD-10-CM | POA: Diagnosis not present

## 2016-12-30 DIAGNOSIS — R140 Abdominal distension (gaseous): Secondary | ICD-10-CM | POA: Diagnosis not present

## 2016-12-30 DIAGNOSIS — R933 Abnormal findings on diagnostic imaging of other parts of digestive tract: Secondary | ICD-10-CM

## 2016-12-30 MED ORDER — GLYCOPYRROLATE 1 MG PO TABS: 1.0000 mg | ORAL_TABLET | Freq: Two times a day (BID) | ORAL | 11 refills | Status: AC

## 2016-12-30 NOTE — Patient Instructions (Signed)
We have sent the following medications to your pharmacy for you to pick up at your convenience: robinul.   Start over the counter Miralax mixing 17 grams in 8 oz of water daily.   Increase your fruit intake daily.   Thank you for choosing me and [facility].  Pricilla Riffle. Dagoberto Ligas., MD., Marval Regal

## 2016-12-30 NOTE — Progress Notes (Signed)
History of Present Illness: This is a 46 year old female referred by April Palumbo, MD for the evaluation of abdominal pain, bloating, constipation, diarrhea.  Patient relates problems for about the past 6 months with frequent generalized abdominal pain abdominal bloating, growling and rumbling.  She has alternating diarrhea and constipation.  She was evaluated in the ED in September and I have reviewed those records.  CT scan findings below.  She is tried Citrucel and MiraLAX at times without adequate relief of symptoms.  She was prescribed Protonix for 1 month after her ED visit which did not improve her symptoms. Denies weight loss, change in stool caliber, melena, hematochezia, nausea, vomiting, dysphagia, reflux symptoms, chest pain.  Abdominal pelvic CT 10/30/2016 IMPRESSION: 1. Mild distal esophageal wall thickening may indicate reflux disease. 2. Mild diffuse fatty infiltration of the liver. 3. No evidence of bowel obstruction or inflammation. Appendix is normal.    Allergies  Allergen Reactions  . Augmentin [Amoxicillin-Pot Clavulanate] Hives  . Keflex [Cephalexin] Hives and Other (See Comments)    Also respiratory depression  . Naproxen Hives  . Zithromax [Azithromycin] Hives  . Morphine And Related Itching    Has to take benadryl with it   Outpatient Medications Prior to Visit  Medication Sig Dispense Refill  . nitroGLYCERIN (NITRODUR - DOSED IN MG/24 HR) 0.2 mg/hr patch Apply 1/4th patch to affected elbow, change daily 30 patch 1  . pantoprazole (PROTONIX) 20 MG tablet Take 1 tablet (20 mg total) by mouth daily. 30 tablet 0  . sucralfate (CARAFATE) 1 GM/10ML suspension Take 10 mLs (1 g total) by mouth 4 (four) times daily -  with meals and at bedtime. 420 mL 0   No facility-administered medications prior to visit.    Past Medical History:  Diagnosis Date  . Anxiety   . Back pain   . Depression   . Migraine   . Vertigo    Past Surgical History:  Procedure  Laterality Date  . ABDOMINAL HYSTERECTOMY    . ABLATION    . CESAREAN SECTION    . TUBAL LIGATION     Social History   Socioeconomic History  . Marital status: Legally Separated    Spouse name: None  . Number of children: 2  . Years of education: None  . Highest education level: None  Social Needs  . Financial resource strain: None  . Food insecurity - worry: None  . Food insecurity - inability: None  . Transportation needs - medical: None  . Transportation needs - non-medical: None  Occupational History  . Occupation: Chartered certified accountant  Tobacco Use  . Smoking status: Current Every Day Smoker    Packs/day: 0.50    Years: 27.00    Pack years: 13.50    Types: Cigarettes  . Smokeless tobacco: Never Used  Substance and Sexual Activity  . Alcohol use: Yes    Comment: occ  . Drug use: No  . Sexual activity: Yes    Birth control/protection: Surgical  Other Topics Concern  . None  Social History Narrative  . None   Family History  Problem Relation Age of Onset  . Diabetes Mother   . Diabetes Father   . Kidney disease Father       Review of Systems: Pertinent positive and negative review of systems were noted in the above HPI section. All other review of systems were otherwise negative.   Physical Exam: General: Well developed, well nourished, no acute distress Head: Normocephalic and  atraumatic Eyes:  sclerae anicteric, EOMI Ears: Normal auditory acuity Mouth: No deformity or lesions Neck: Supple, no masses or thyromegaly Lungs: Clear throughout to auscultation Heart: Regular rate and rhythm; no murmurs, rubs or bruits Abdomen: Soft, generalized tenderness to palpation without rebound or guarding and non distended. No masses, hepatosplenomegaly or hernias noted.  Hyperactive bowel sounds Rectal: not done Musculoskeletal: Symmetrical with no gross deformities  Skin: No lesions on visible extremities Pulses:  Normal pulses noted Extremities: No clubbing, cyanosis,  edema or deformities noted Neurological: Alert oriented x 4, grossly nonfocal Cervical Nodes:  No significant cervical adenopathy Inguinal Nodes: No significant inguinal adenopathy Psychological:  Alert and cooperative. Normal mood and affect  Assessment and Recommendations:  1.  Suspected IBS with alternating diarrhea and constipation.  Begin glycopyrrolate 1 mg twice daily.  Avoid foods that trigger symptoms.  High-fiber diet with adequate daily water intake.  MiraLAX once or twice daily on a regular basis to help prevent constipation and target for a complete daily bowel movement.  Hold Miralax for days with loose stools.  Consider modifying diet to avoid red meat, high fat foods and assess response.  Consider colonoscopy for further evaluation. REV in 1 month.   2.  Esophageal wall thickening noted on CT scan.  Suspectd GERD.  Pending symptom response at her return visit consider EGD.  3. Fatty infiltration of the liver.  Long-term fat modified in card modified diet supervised by her PCP.   cc: April Palumbo, MD

## 2017-01-13 ENCOUNTER — Ambulatory Visit: Payer: Self-pay | Admitting: Family Medicine

## 2017-01-20 ENCOUNTER — Ambulatory Visit: Payer: Self-pay | Admitting: Family Medicine

## 2017-01-24 ENCOUNTER — Other Ambulatory Visit: Payer: Self-pay

## 2017-01-24 ENCOUNTER — Emergency Department (HOSPITAL_BASED_OUTPATIENT_CLINIC_OR_DEPARTMENT_OTHER)
Admission: EM | Admit: 2017-01-24 | Discharge: 2017-01-24 | Disposition: A | Discharge status: Home / Self Care | Payer: 59 | Attending: Emergency Medicine | Admitting: Emergency Medicine

## 2017-01-24 ENCOUNTER — Encounter (HOSPITAL_BASED_OUTPATIENT_CLINIC_OR_DEPARTMENT_OTHER): Payer: Self-pay | Admitting: Emergency Medicine

## 2017-01-24 DIAGNOSIS — M545 Low back pain, unspecified: Secondary | ICD-10-CM

## 2017-01-24 DIAGNOSIS — F17210 Nicotine dependence, cigarettes, uncomplicated: Secondary | ICD-10-CM | POA: Diagnosis not present

## 2017-01-24 DIAGNOSIS — M79645 Pain in left finger(s): Secondary | ICD-10-CM | POA: Insufficient documentation

## 2017-01-24 MED ORDER — HYDROCODONE-ACETAMINOPHEN 5-325 MG PO TABS
1.0000 | ORAL_TABLET | Freq: Once | ORAL | Status: AC
  Administered 2017-01-24: 1 via ORAL
  Filled 2017-01-24: qty 1

## 2017-01-24 MED ORDER — HYDROCODONE-ACETAMINOPHEN 5-325 MG PO TABS: 1.0000 | ORAL_TABLET | ORAL | 0 refills | Status: DC | PRN

## 2017-01-24 NOTE — ED Notes (Signed)
Pt discharged to home with family. NAD.  

## 2017-01-24 NOTE — ED Notes (Signed)
PMS intact before and after. Pt tolerated well. All questions answered. 

## 2017-01-24 NOTE — ED Notes (Signed)
ED Provider at bedside. 

## 2017-01-24 NOTE — ED Provider Notes (Addendum)
[facility] DEPT [facility] Provider Note: Georgena Spurling, MD, FACEP  CSN: [id number] MRN: [id number] ARRIVAL: 01/24/17 at [place]: [place]  Back Pain and Hand Pain   HISTORY OF PRESENT ILLNESS  01/24/17 2:41 AM Shelby Moreno is a 45 y.o. female with about a 2 to 3-week history of low back pain and pain in the left thumb MCP joint.  She rates her pain as an 8 out of 10 presently.  It is worse with movement.  She describes the pain is an aching pain.  She denies injury or repetitive use but admits she could have strained either moving patients at work.  There is no associated deformity, numbness or weakness. She has not taken anything for the pain.   Consultation with the [facility] state controlled substances database reveals the patient has received 3 prescriptions for opioid pain medication in the past year, 2 of which were for tramadol.    Past Medical History:  Diagnosis Date  . Anxiety   . Back pain   . Depression   . Migraine   . Vertigo     Past Surgical History:  Procedure Laterality Date  . ABDOMINAL HYSTERECTOMY    . ABLATION    . CESAREAN SECTION    . TUBAL LIGATION      Family History  Problem Relation Age of Onset  . Diabetes Mother   . Diabetes Father   . Kidney disease Father     Social History   Tobacco Use  . Smoking status: Current Every Day Smoker    Packs/day: 0.50    Years: 27.00    Pack years: 13.50    Types: Cigarettes  . Smokeless tobacco: Never Used  Substance Use Topics  . Alcohol use: Yes    Comment: occ  . Drug use: No    Prior to Admission medications   Medication Sig Start Date End Date Taking? Authorizing Provider  glycopyrrolate (ROBINUL) 1 MG tablet Take 1 tablet (1 mg total) by mouth 2 (two) times daily. 12/30/16   Ladene Artist, MD    Allergies Augmentin [amoxicillin-pot clavulanate]; Keflex [cephalexin]; Naproxen; Zithromax [azithromycin]; and Morphine and related   REVIEW OF SYSTEMS    Negative except as noted here or in the History of Present Illness.   PHYSICAL EXAMINATION  Initial Vital Signs Blood pressure 124/89, pulse 83, temperature 99 F (37.2 C), temperature source Oral, resp. rate 16, last menstrual period 05/21/2016, SpO2 99 %.  Examination General: Well-developed, well-nourished female in no acute distress; appearance consistent with age of record HENT: normocephalic; atraumatic Eyes: pupils equal, round and reactive to light; extraocular muscles intact Neck: supple Heart: regular rate and rhythm Lungs: clear to auscultation bilaterally Abdomen: soft; nondistended; nontender; bowel sounds present Back: Mid lower back tenderness Extremities: No deformity; full range of motion except left thumb; tenderness of left thumb MCP joint with decreased range of motion Neurologic: Awake, alert and oriented; motor function intact in all extremities and symmetric; no facial droop Skin: Warm and dry Psychiatric: Normal mood and affect   RESULTS  Summary of this visit's results, reviewed by myself:   EKG Interpretation  Date/Time:    Ventricular Rate:    PR Interval:    QRS Duration:   QT Interval:    QTC Calculation:   R Axis:     Text Interpretation:        Laboratory Studies: No results found for this or any previous visit (from the past 24 hour(s)).  Imaging Studies: No results found.  ED COURSE  Nursing notes and initial vitals signs, including pulse oximetry, reviewed.  Vitals:   01/24/17 0054  BP: 124/89  Pulse: 83  Resp: 16  Temp: 99 F (37.2 C)  TempSrc: Oral  SpO2: 99%   The patient has an appointment with Dr. Barbaraann Barthel of sports medicine in 3 days.  PROCEDURES    ED DIAGNOSES     ICD-10-CM   1. Thumb pain, left M79.645   2. Acute midline low back pain without sciatica M54.5        Shelby Moreno, Jenny Reichmann, MD 01/24/17 0258    Shanon Rosser, MD 01/24/17 [phone]

## 2017-01-24 NOTE — ED Triage Notes (Signed)
PT presents with c/o lower back pain and left hand thumb pain both for 2-3 weeks. 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

## 2017-01-27 ENCOUNTER — Encounter: Payer: Self-pay | Admitting: Family Medicine

## 2017-01-27 ENCOUNTER — Ambulatory Visit (INDEPENDENT_AMBULATORY_CARE_PROVIDER_SITE_OTHER): Payer: 59 | Admitting: Family Medicine

## 2017-01-27 DIAGNOSIS — M6530 Trigger finger, unspecified finger: Secondary | ICD-10-CM

## 2017-01-27 DIAGNOSIS — G8929 Other chronic pain: Secondary | ICD-10-CM

## 2017-01-27 DIAGNOSIS — M5441 Lumbago with sciatica, right side: Secondary | ICD-10-CM

## 2017-01-27 MED ORDER — HYDROCODONE-ACETAMINOPHEN 5-325 MG PO TABS: 1.0000 | ORAL_TABLET | Freq: Four times a day (QID) | ORAL | 0 refills | Status: DC | PRN

## 2017-01-27 MED ORDER — PREDNISONE 10 MG PO TABS: ORAL_TABLET | ORAL | 0 refills | Status: DC

## 2017-01-27 MED ORDER — METHYLPREDNISOLONE ACETATE 40 MG/ML IJ SUSP
20.0000 mg | Freq: Once | INTRAMUSCULAR | Status: AC
  Administered 2017-01-27: 20 mg via INTRA_ARTICULAR

## 2017-01-27 MED ORDER — TIZANIDINE HCL 4 MG PO TABS: 4.0000 mg | ORAL_TABLET | Freq: Three times a day (TID) | ORAL | 1 refills | Status: DC | PRN

## 2017-01-27 NOTE — Progress Notes (Signed)
PCP: Annitta Jersey., MD  Subjective:   HPI: Patient is a 46 y.o. female here for left thumb, low back pain.  Patient has had prior issues with low back and left thumb in past. Back issues about 2 years ago, thumb at beginning of the year. Pain started past few days in left thumb, associated catching and locking. Worse trying to pick something up. Pain level sharp, 9/10. Also with low back pain radiating into right leg to toes. Associated numbness. No skin changes. Pain level 10/10 and sharp. Felt like she got stuck bending forwards. No bowel/bladder dysfunction.  Past Medical History:  Diagnosis Date  . Anxiety   . Back pain   . Depression   . Migraine   . Vertigo     Current Outpatient Medications on File Prior to Visit  Medication Sig Dispense Refill  . glycopyrrolate (ROBINUL) 1 MG tablet Take 1 tablet (1 mg total) by mouth 2 (two) times daily. 60 tablet 11   No current facility-administered medications on file prior to visit.     Past Surgical History:  Procedure Laterality Date  . ABDOMINAL HYSTERECTOMY    . ABLATION    . CESAREAN SECTION    . TUBAL LIGATION      Allergies  Allergen Reactions  . Augmentin [Amoxicillin-Pot Clavulanate] Hives  . Keflex [Cephalexin] Hives and Other (See Comments)    Also respiratory depression  . Naproxen Hives  . Zithromax [Azithromycin] Hives  . Morphine And Related Itching    Has to take benadryl with it    Social History   Socioeconomic History  . Marital status: Legally Separated    Spouse name: Not on file  . Number of children: 2  . Years of education: Not on file  . Highest education level: Not on file  Social Needs  . Financial resource strain: Not on file  . Food insecurity - worry: Not on file  . Food insecurity - inability: Not on file  . Transportation needs - medical: Not on file  . Transportation needs - non-medical: Not on file  Occupational History  . Occupation: Chartered certified accountant  Tobacco Use  .  Smoking status: Current Every Day Smoker    Packs/day: 0.50    Years: 27.00    Pack years: 13.50    Types: Cigarettes  . Smokeless tobacco: Never Used  Substance and Sexual Activity  . Alcohol use: Yes    Comment: occ  . Drug use: No  . Sexual activity: Yes    Birth control/protection: Surgical  Other Topics Concern  . Not on file  Social History Narrative  . Not on file    Family History  Problem Relation Age of Onset  . Diabetes Mother   . Diabetes Father   . Kidney disease Father     BP 120/79   Pulse 84   Ht 5\' 6"  (1.676 m)   Wt 148 lb (67.1 kg)   LMP 05/21/2016   BMI 23.89 kg/m   Review of Systems: See HPI above.     Objective:  Physical Exam:  Gen: NAD, comfortable in exam room  Left hand: Nodule at A1 pulley of 1st digit.  No bruising, swelling, other deformity. TTP within nodule noted above.  No other tenderness. FROM with catching on flexion if IP joint of 1st digit. Collateral ligaments intact. Strength 5/5 flexion and extension of 1st digit. NVI distally.  Back: No gross deformity, scoliosis. TTP midline and right > left paraspinal regions lumbar spine.  Full extension but only 30 degrees flexion, painful. Strength 4/5 bilateral hip flexion, knee extension limited by pain.  Other LE muscle groups 5/5. 1+ MSRs in patellar and achilles tendons, equal bilaterally. Positive SLR on right. Sensation intact to light touch bilaterally. Negative logroll bilateral hips   Assessment & Plan:  1. Left trigger thumb - repeated injection today.  Thumb loop splint provided.  Heat, f/u in 1 month or prn.  After informed written consent timeout was performed.  Patient was seated on exam table.  Area overlying left 1st digit A1 pulley prepped with alcohol swab then injected with 0.5:0.75mL bupivicaine: depomedrol.  Patient tolerated procedure well without immediate complications.  2. Low back pain - 2/2 radiculopathy.  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  Start prednisone dose pack with zanaflex, norco if needed.  F/u in 1 month but call sooner if not improving.

## 2017-01-27 NOTE — Assessment & Plan Note (Signed)
2/2 radiculopathy.  History of disc herniation at L5-S1 with radiculopathy, similar symptoms.  Start prednisone dose pack with zanaflex, norco if needed.  F/u in 1 month but call sooner if not improving.

## 2017-01-27 NOTE — Assessment & Plan Note (Signed)
repeated injection today.  Thumb loop splint provided.  Heat, f/u in 1 month or prn.  After informed written consent timeout was performed.  Patient was seated on exam table.  Area overlying left 1st digit A1 pulley prepped with alcohol swab then injected with 0.5:0.72mL bupivicaine: depomedrol.  Patient tolerated procedure well without immediate complications.

## 2017-01-27 NOTE — Patient Instructions (Signed)
You have a trigger thumb. You were given a cortisone injection today. Heat 15 minutes at a time 3-4 times a day. Wear splint as needed (hopefully after a week or so you won't need this at all). Follow up with me in 1 month or as needed.  You have lumbar radiculopathy (a pinched nerve in your low back). Take tylenol for baseline pain relief (1-2 extra strength tabs 3x/day) A prednisone dose pack is the best option for immediate relief and may be prescribed. Day after finishing prednisone start ibuprofen as needed with food for pain and inflammation. Norco as needed for severe pain (no driving on this medicine). Tizanidine as needed for muscle spasms (no driving on this medicine if it makes you sleepy). Stay as active as possible. Physical therapy has been shown to be helpful as well. Strengthening of low back muscles, abdominal musculature are key for long term pain relief. If not improving, will consider further imaging (MRI). Follow up with me in 1 month but call me sooner if not improving.

## 2017-01-29 ENCOUNTER — Ambulatory Visit: Payer: Self-pay | Admitting: Physician Assistant

## 2017-04-07 ENCOUNTER — Other Ambulatory Visit: Payer: Self-pay

## 2017-04-07 ENCOUNTER — Encounter (HOSPITAL_BASED_OUTPATIENT_CLINIC_OR_DEPARTMENT_OTHER): Payer: Self-pay

## 2017-04-07 ENCOUNTER — Emergency Department (HOSPITAL_BASED_OUTPATIENT_CLINIC_OR_DEPARTMENT_OTHER)
Admission: EM | Admit: 2017-04-07 | Discharge: 2017-04-07 | Disposition: A | Discharge status: Home / Self Care | Payer: Self-pay | Attending: Emergency Medicine | Admitting: Emergency Medicine

## 2017-04-07 DIAGNOSIS — G43009 Migraine without aura, not intractable, without status migrainosus: Secondary | ICD-10-CM | POA: Insufficient documentation

## 2017-04-07 DIAGNOSIS — F17210 Nicotine dependence, cigarettes, uncomplicated: Secondary | ICD-10-CM | POA: Insufficient documentation

## 2017-04-07 DIAGNOSIS — Z79899 Other long term (current) drug therapy: Secondary | ICD-10-CM | POA: Insufficient documentation

## 2017-04-07 DIAGNOSIS — R42 Dizziness and giddiness: Secondary | ICD-10-CM | POA: Insufficient documentation

## 2017-04-07 MED ORDER — DIAZEPAM 5 MG PO TABS
5.0000 mg | ORAL_TABLET | Freq: Once | ORAL | Status: AC
  Administered 2017-04-07: 5 mg via ORAL
  Filled 2017-04-07: qty 1

## 2017-04-07 MED ORDER — KETOROLAC TROMETHAMINE 30 MG/ML IJ SOLN
30.0000 mg | Freq: Once | INTRAMUSCULAR | Status: AC
  Administered 2017-04-07: 30 mg via INTRAMUSCULAR
  Filled 2017-04-07: qty 1

## 2017-04-07 MED ORDER — MECLIZINE HCL 25 MG PO TABS: 25.0000 mg | ORAL_TABLET | Freq: Three times a day (TID) | ORAL | 0 refills | Status: DC | PRN

## 2017-04-07 NOTE — ED Triage Notes (Signed)
Pt reports that she has a hx of vertigo and she had a gradual onset of dizziness that started yesterday. Pt has no unilateral weakness on brief neuro exam.

## 2017-04-07 NOTE — ED Provider Notes (Signed)
[facility] Provider Note   CSN: [id number] Arrival date & time: 04/07/17  0054     History   Chief Complaint Chief Complaint  Patient presents with  . Dizziness    HPI Shelby Moreno is a 47 y.o. female.  HPI  This is a 47 year old female with a history of migraines, vertigo, anxiety who presents with dizziness.  Patient reports 2-day history of room spinning dizziness.  Worse with position changes.  She also reports a dull frontal headache.  She has a history of migraines.  She states that this is somewhat different.  She has not taken anything for her pain.  She is out of meclizine for her vertigo but states that that usually does not help.  She denies any focal complaints including weakness, numbness, tingling, speech difficulties.  Currently she rates her headache at 8 out of 10.  She endorses photophobia.  Eyes any recent upper respiratory symptoms, fevers, neck stiffness, neck pain.  Past Medical History:  Diagnosis Date  . Anxiety   . Back pain   . Depression   . Migraine   . Vertigo     Patient Active Problem List   Diagnosis Date Noted  . Trigger finger, acquired 03/04/2016  . Right arm pain 11/24/2015  . Bilateral knee pain 03/15/2015  . Neck pain 01/04/2014  . Low back pain 01/04/2014    Past Surgical History:  Procedure Laterality Date  . ABDOMINAL HYSTERECTOMY    . ABLATION    . CESAREAN SECTION    . TUBAL LIGATION      OB History    No data available       Home Medications    Prior to Admission medications   Medication Sig Start Date End Date Taking? Authorizing Provider  glycopyrrolate (ROBINUL) 1 MG tablet Take 1 tablet (1 mg total) by mouth 2 (two) times daily. 12/30/16   Ladene Artist, MD  HYDROcodone-acetaminophen (NORCO) 5-325 MG tablet Take 1 tablet by mouth every 6 (six) hours as needed for moderate pain. 01/27/17   Hudnall, Sharyn Lull, MD  meclizine (ANTIVERT) 25 MG tablet Take 1 tablet (25 mg total)  by mouth 3 (three) times daily as needed for dizziness. 04/07/17   Horton, Barbette Hair, MD  predniSONE (DELTASONE) 10 MG tablet 6 tabs po day 1, 5 tabs po day 2, 4 tabs po day 3, 3 tabs po day 4, 2 tabs po day 5, 1 tab po day 6 01/27/17   Hudnall, Sharyn Lull, MD  tiZANidine (ZANAFLEX) 4 MG tablet Take 1 tablet (4 mg total) by mouth every 8 (eight) hours as needed for muscle spasms. 01/27/17   Dene Gentry, MD    Family History Family History  Problem Relation Age of Onset  . Diabetes Mother   . Diabetes Father   . Kidney disease Father     Social History Social History   Tobacco Use  . Smoking status: Current Every Day Smoker    Packs/day: 0.25    Years: 27.00    Pack years: 6.75    Types: Cigarettes  . Smokeless tobacco: Never Used  Substance Use Topics  . Alcohol use: Yes    Comment: occ  . Drug use: No     Allergies   Augmentin [amoxicillin-pot clavulanate]; Keflex [cephalexin]; Naproxen; Zithromax [azithromycin]; and Morphine and related   Review of Systems Review of Systems  Constitutional: Negative for fever.  HENT: Negative for congestion.   Eyes: Positive for photophobia. Negative  for visual disturbance.  Respiratory: Negative for shortness of breath.   Cardiovascular: Negative for chest pain.  Gastrointestinal: Positive for nausea. Negative for abdominal pain and vomiting.  Musculoskeletal: Negative for gait problem, neck pain and neck stiffness.  Neurological: Negative for speech difficulty, weakness and numbness.  All other systems reviewed and are negative.    Physical Exam Updated Vital Signs BP 114/75 (BP Location: Left Arm)   Pulse 70   Temp 97.9 F (36.6 C) (Oral)   Resp 18   Ht 5\' 6"  (1.676 m)   Wt 68.5 kg (151 lb)   LMP 05/21/2016   SpO2 100%   BMI 24.37 kg/m   Physical Exam  Constitutional: She is oriented to person, place, and time. She appears well-developed and well-nourished. No distress.  HENT:  Head: Normocephalic and  atraumatic.  Mouth/Throat: Oropharynx is clear and moist.  Eyes: EOM are normal. Pupils are equal, round, and reactive to light.  Horizontal nystagmus noted  Neck: Normal range of motion. Neck supple.  No meningismus  Cardiovascular: Normal rate, regular rhythm and normal heart sounds.  No murmur heard. Pulmonary/Chest: Effort normal and breath sounds normal. No respiratory distress. She has no wheezes.  Abdominal: Soft. There is no tenderness.  Neurological: She is alert and oriented to person, place, and time.  Radial nerves II through XII intact, 5 out of 5 strength in all 4 extremities, no dysmetria to finger-nose-finger, normal gait  Skin: Skin is warm and dry.  Psychiatric: She has a normal mood and affect.  Nursing note and vitals reviewed.    ED Treatments / Results  Labs (all labs ordered are listed, but only abnormal results are displayed) Labs Reviewed - No data to display  EKG  EKG Interpretation None       Radiology No results found.  Procedures Procedures (including critical care time)  Medications Ordered in ED Medications  diazepam (VALIUM) tablet 5 mg (5 mg Oral Given 04/07/17 0308)  ketorolac (TORADOL) 30 MG/ML injection 30 mg (30 mg Intramuscular Given 04/07/17 0308)     Initial Impression / Assessment and Plan / ED Course  I have reviewed the triage vital signs and the nursing notes.  Pertinent labs & imaging results that were available during my care of the patient were reviewed by me and considered in my medical decision making (see chart for details).     Patient presents with headache and vertigo.  She has a history of similar symptoms in the past.  She is overall nontoxic appearing on exam and vital signs are reassuring.  Neurologic exam is normal.  There are no red flags regarding her headache or her vertigo.  Suspect peripheral vertigo likely complicating migraine.  Patient was given Toradol and Valium.  She rested comfortably.  On recheck,  she states she feels much improved with no persistent symptoms.  Will discharge home with a prescription for meclizine.  After history, exam, and medical workup I feel the patient has been appropriately medically screened and is safe for discharge home. Pertinent diagnoses were discussed with the patient. Patient was given return precautions.   Final Clinical Impressions(s) / ED Diagnoses   Final diagnoses:  Vertigo  Migraine without aura and without status migrainosus, not intractable    ED Discharge Orders        Ordered    meclizine (ANTIVERT) 25 MG tablet  3 times daily PRN     04/07/17 0516       Merryl Hacker, MD 04/07/17 [phone]

## 2017-07-10 ENCOUNTER — Emergency Department (HOSPITAL_BASED_OUTPATIENT_CLINIC_OR_DEPARTMENT_OTHER)
Admission: EM | Admit: 2017-07-10 | Discharge: 2017-07-10 | Disposition: A | Discharge status: Home / Self Care | Payer: Self-pay | Attending: Emergency Medicine | Admitting: Emergency Medicine

## 2017-07-10 ENCOUNTER — Other Ambulatory Visit: Payer: Self-pay

## 2017-07-10 ENCOUNTER — Encounter (HOSPITAL_BASED_OUTPATIENT_CLINIC_OR_DEPARTMENT_OTHER): Payer: Self-pay | Admitting: Emergency Medicine

## 2017-07-10 DIAGNOSIS — L539 Erythematous condition, unspecified: Secondary | ICD-10-CM | POA: Insufficient documentation

## 2017-07-10 DIAGNOSIS — L0390 Cellulitis, unspecified: Secondary | ICD-10-CM

## 2017-07-10 MED ORDER — DOXYCYCLINE HYCLATE 100 MG PO TABS
100.0000 mg | ORAL_TABLET | Freq: Once | ORAL | Status: AC
  Administered 2017-07-10: 100 mg via ORAL
  Filled 2017-07-10: qty 1

## 2017-07-10 MED ORDER — DOXYCYCLINE HYCLATE 100 MG PO CAPS: 100.0000 mg | ORAL_CAPSULE | Freq: Two times a day (BID) | ORAL | 0 refills | Status: DC

## 2017-07-10 NOTE — ED Notes (Signed)
Pt updated on the waiting time and that there is only one EDP at this time and will be on her room ASAP. Pt and family verbalized understanding.

## 2017-07-10 NOTE — ED Provider Notes (Signed)
[facility] Provider Note   CSN: [id number] Arrival date & time: 07/10/17  0341     History   Chief Complaint Chief Complaint  Patient presents with  . Insect Bite    HPI Shelby Moreno is a 47 y.o. female.  The history is provided by the patient.  Animal Bite  Contact animal:  Insect Location:  Leg Leg injury location:  L lower leg Time since incident:  6 days Pain details:    Quality:  Itching and aching   Severity:  Severe   Timing:  Constant   Progression:  Unchanged Incident location:  Outside Provoked: unprovoked   Notifications:  None Animal's rabies vaccination status:  Never received Animal in possession: no   Relieved by:  Nothing Worsened by:  Nothing Ineffective treatments: benadryl and the tramadol RX she got yesterday from [place]  Associated symptoms: no fever   Seen at [facility] yesterday and given tramadol RX and benadryl and redness persists.  No f/c/r  Past Medical History:  Diagnosis Date  . Anxiety   . Back pain   . Depression   . Migraine   . Vertigo     Patient Active Problem List   Diagnosis Date Noted  . Trigger finger, acquired 03/04/2016  . Right arm pain 11/24/2015  . Bilateral knee pain 03/15/2015  . Neck pain 01/04/2014  . Low back pain 01/04/2014    Past Surgical History:  Procedure Laterality Date  . ABDOMINAL HYSTERECTOMY    . ABLATION    . CESAREAN SECTION    . TUBAL LIGATION       OB History   None      Home Medications    Prior to Admission medications   Medication Sig Start Date End Date Taking? Authorizing Provider  glycopyrrolate (ROBINUL) 1 MG tablet Take 1 tablet (1 mg total) by mouth 2 (two) times daily. 12/30/16   Ladene Artist, MD  HYDROcodone-acetaminophen (NORCO) 5-325 MG tablet Take 1 tablet by mouth every 6 (six) hours as needed for moderate pain. 01/27/17   Hudnall, Sharyn Lull, MD  meclizine (ANTIVERT) 25 MG tablet Take 1 tablet (25 mg total) by mouth 3 (three)  times daily as needed for dizziness. 04/07/17   Horton, Barbette Hair, MD  predniSONE (DELTASONE) 10 MG tablet 6 tabs po day 1, 5 tabs po day 2, 4 tabs po day 3, 3 tabs po day 4, 2 tabs po day 5, 1 tab po day 6 01/27/17   Hudnall, Sharyn Lull, MD  tiZANidine (ZANAFLEX) 4 MG tablet Take 1 tablet (4 mg total) by mouth every 8 (eight) hours as needed for muscle spasms. 01/27/17   Dene Gentry, MD    Family History Family History  Problem Relation Age of Onset  . Diabetes Mother   . Diabetes Father   . Kidney disease Father     Social History Social History   Tobacco Use  . Smoking status: Current Every Day Smoker    Packs/day: 0.25    Years: 27.00    Pack years: 6.75    Types: Cigarettes  . Smokeless tobacco: Never Used  Substance Use Topics  . Alcohol use: Yes    Comment: occ  . Drug use: No     Allergies   Augmentin [amoxicillin-pot clavulanate]; Keflex [cephalexin]; Naproxen; Zithromax [azithromycin]; and Morphine and related   Review of Systems Review of Systems  Constitutional: Negative for fever.  Respiratory: Negative for shortness of breath.   Gastrointestinal: Negative  for nausea.  Musculoskeletal: Negative for neck pain and neck stiffness.  Skin: Positive for color change.  All other systems reviewed and are negative.    Physical Exam Updated Vital Signs BP 118/69 (BP Location: Left Arm)   Temp 98.4 F (36.9 C) (Oral)   Resp 14   Ht 5\' 6"  (1.676 m)   Wt 67.3 kg (148 lb 5 oz)   LMP 05/21/2016   SpO2 100%   BMI 23.94 kg/m   Physical Exam  Constitutional: She is oriented to person, place, and time. She appears well-developed and well-nourished. No distress.  HENT:  Head: Normocephalic and atraumatic.  Nose: Nose normal.  Mouth/Throat: No oropharyngeal exudate.  Eyes: Pupils are equal, round, and reactive to light. Conjunctivae are normal.  Neck: Normal range of motion. Neck supple.  Cardiovascular: Normal rate, regular rhythm, normal heart sounds and  intact distal pulses.  Pulmonary/Chest: Effort normal and breath sounds normal.  Abdominal: Soft. Bowel sounds are normal. There is no tenderness. There is no guarding.  Musculoskeletal: Normal range of motion. She exhibits no edema or tenderness.  Neurological: She is alert and oriented to person, place, and time.  Skin: Skin is warm and dry. Capillary refill takes less than 2 seconds.     Psychiatric: She has a normal mood and affect.     ED Treatments / Results  Labs (all labs ordered are listed, but only abnormal results are displayed) Labs Reviewed - No data to display  EKG None  Radiology No results found.  Procedures Procedures (including critical care time)  Medications Ordered in ED Medications  doxycycline (VIBRA-TABS) tablet 100 mg (has no administration in time range)     Wound care by ED tech Final Clinical Impressions(s) / ED Diagnoses   I do not feel narcotics are indicated.  I will start doxycycline for early cellulitis.  Follow up with your PMD for ongoing care.  Return for weakness, numbness, changes in vision or speech, fevers >100.4 unrelieved by medication, shortness of breath, intractable vomiting, or diarrhea, abdominal pain, Inability to tolerate liquids or food, cough, altered mental status or any concerns. No signs of systemic illness or infection. The patient is nontoxic-appearing on exam and vital signs are within normal limits.   I have reviewed the triage vital signs and the nursing notes. Pertinent labs &imaging results that were available during my care of the patient were reviewed by me and considered in my medical decision making (see chart for details).  After history, exam, and medical workup I feel the patient has been appropriately medically screened and is safe for discharge home. Pertinent diagnoses were discussed with the patient. Patient was given return precautions.     Lyfe Reihl, MD 07/10/17 [phone]

## 2017-07-10 NOTE — ED Triage Notes (Signed)
Pt presents with c/o multiple insect bites to left lower leg and swelling to that leg

## 2017-11-13 ENCOUNTER — Emergency Department (HOSPITAL_BASED_OUTPATIENT_CLINIC_OR_DEPARTMENT_OTHER)
Admission: EM | Admit: 2017-11-13 | Discharge: 2017-11-13 | Disposition: A | Discharge status: Home / Self Care | Payer: Self-pay | Attending: Emergency Medicine | Admitting: Emergency Medicine

## 2017-11-13 ENCOUNTER — Encounter (HOSPITAL_BASED_OUTPATIENT_CLINIC_OR_DEPARTMENT_OTHER): Payer: Self-pay | Admitting: *Deleted

## 2017-11-13 ENCOUNTER — Other Ambulatory Visit: Payer: Self-pay

## 2017-11-13 DIAGNOSIS — Z79899 Other long term (current) drug therapy: Secondary | ICD-10-CM | POA: Insufficient documentation

## 2017-11-13 DIAGNOSIS — F17210 Nicotine dependence, cigarettes, uncomplicated: Secondary | ICD-10-CM | POA: Insufficient documentation

## 2017-11-13 DIAGNOSIS — G43911 Migraine, unspecified, intractable, with status migrainosus: Secondary | ICD-10-CM | POA: Insufficient documentation

## 2017-11-13 DIAGNOSIS — R42 Dizziness and giddiness: Secondary | ICD-10-CM | POA: Insufficient documentation

## 2017-11-13 MED ORDER — DIPHENHYDRAMINE HCL 50 MG/ML IJ SOLN
25.0000 mg | Freq: Once | INTRAMUSCULAR | Status: AC
  Administered 2017-11-13: 25 mg via INTRAVENOUS
  Filled 2017-11-13: qty 1

## 2017-11-13 MED ORDER — METOCLOPRAMIDE HCL 5 MG/ML IJ SOLN
10.0000 mg | Freq: Once | INTRAMUSCULAR | Status: AC
  Administered 2017-11-13: 10 mg via INTRAVENOUS
  Filled 2017-11-13: qty 2

## 2017-11-13 MED ORDER — DEXAMETHASONE SODIUM PHOSPHATE 10 MG/ML IJ SOLN
10.0000 mg | Freq: Once | INTRAMUSCULAR | Status: AC
  Administered 2017-11-13: 10 mg via INTRAVENOUS
  Filled 2017-11-13: qty 1

## 2017-11-13 NOTE — ED Provider Notes (Signed)
[facility] EMERGENCY DEPARTMENT Provider Note   CSN: [id number] Arrival date & time: 11/13/17  2132     History   Chief Complaint Chief Complaint  Patient presents with  . Dizziness    HPI Shelby Moreno is a 47 y.o. female.  The history is provided by the patient.  Dizziness  Quality:  Room spinning and head spinning Severity:  Moderate Onset quality:  Sudden Duration:  7 days Timing:  Constant Progression:  Waxing and waning Chronicity:  Recurrent Context comment:  Started spontaneously Relieved by:  Being still Worsened by:  Lying down, movement, turning head and eye movement Ineffective treatments: tried meclizine but not working this time. Associated symptoms: no hearing loss, no nausea, no palpitations, no shortness of breath, no tinnitus, no vision changes, no vomiting and no weakness   Associated symptoms comment:  Also feels like a migraine headache she has had in the past now for days.  She also has photophobia.  She states she gets migraines frequently but not always the dizziness with them.  However every time she has the dizziness she always has a migraine. Risk factors: hx of vertigo   Risk factors: no heart disease, no multiple medications and no new medications     Past Medical History:  Diagnosis Date  . Anxiety   . Back pain   . Depression   . Migraine   . Vertigo     Patient Active Problem List   Diagnosis Date Noted  . Trigger finger, acquired 03/04/2016  . Right arm pain 11/24/2015  . Bilateral knee pain 03/15/2015  . Neck pain 01/04/2014  . Low back pain 01/04/2014    Past Surgical History:  Procedure Laterality Date  . ABDOMINAL HYSTERECTOMY    . ABLATION    . CESAREAN SECTION    . TUBAL LIGATION       OB History   None      Home Medications    Prior to Admission medications   Medication Sig Start Date End Date Taking? Authorizing Provider  meclizine (ANTIVERT) 25 MG tablet Take 1 tablet (25 mg total) by mouth 3  (three) times daily as needed for dizziness. 04/07/17  Yes Horton, Barbette Hair, MD  tiZANidine (ZANAFLEX) 4 MG tablet Take 1 tablet (4 mg total) by mouth every 8 (eight) hours as needed for muscle spasms. 01/27/17  Yes Hudnall, Sharyn Lull, MD  doxycycline (VIBRAMYCIN) 100 MG capsule Take 1 capsule (100 mg total) by mouth 2 (two) times daily. One po bid x 7 days 07/10/17   Palumbo, April, MD  glycopyrrolate (ROBINUL) 1 MG tablet Take 1 tablet (1 mg total) by mouth 2 (two) times daily. 12/30/16   Ladene Artist, MD  HYDROcodone-acetaminophen (NORCO) 5-325 MG tablet Take 1 tablet by mouth every 6 (six) hours as needed for moderate pain. 01/27/17   Hudnall, Sharyn Lull, MD  predniSONE (DELTASONE) 10 MG tablet 6 tabs po day 1, 5 tabs po day 2, 4 tabs po day 3, 3 tabs po day 4, 2 tabs po day 5, 1 tab po day 6 01/27/17   Hudnall, Sharyn Lull, MD    Family History Family History  Problem Relation Age of Onset  . Diabetes Mother   . Diabetes Father   . Kidney disease Father     Social History Social History   Tobacco Use  . Smoking status: Current Every Day Smoker    Packs/day: 0.25    Years: 27.00    Pack years: 6.75  Types: Cigarettes  . Smokeless tobacco: Never Used  Substance Use Topics  . Alcohol use: Yes    Comment: occ  . Drug use: No     Allergies   Augmentin [amoxicillin-pot clavulanate]; Keflex [cephalexin]; Naproxen; Zithromax [azithromycin]; and Morphine and related   Review of Systems Review of Systems  HENT: Negative for hearing loss and tinnitus.   Respiratory: Negative for shortness of breath.   Cardiovascular: Negative for palpitations.  Gastrointestinal: Negative for nausea and vomiting.  Neurological: Positive for dizziness. Negative for weakness.  All other systems reviewed and are negative.    Physical Exam Updated Vital Signs BP 123/81 (BP Location: Left Arm)   Pulse 94   Temp 98.7 F (37.1 C) (Oral)   Resp 20   Ht 5\' 6"  (1.676 m)   Wt 70.3 kg   LMP  05/21/2016   SpO2 97%   BMI 25.02 kg/m   Physical Exam  Constitutional: She is oriented to person, place, and time. She appears well-developed and well-nourished. No distress.  HENT:  Head: Normocephalic and atraumatic.  Eyes: Pupils are equal, round, and reactive to light. EOM are normal.  Photophobia.  Cardiovascular: Normal rate, regular rhythm, normal heart sounds and intact distal pulses. Exam reveals no friction rub.  No murmur heard. Pulmonary/Chest: Effort normal and breath sounds normal. She has no wheezes. She has no rales.  Abdominal: Soft. Bowel sounds are normal. She exhibits no distension. There is no tenderness. There is no rebound and no guarding.  Musculoskeletal: Normal range of motion. She exhibits no tenderness.  No edema  Neurological: She is alert and oriented to person, place, and time. She has normal strength. No cranial nerve deficit or sensory deficit.  No pronator drift.  Normal gait.  Normal heel-to-shin bilaterally.  No nystagmus noted  Skin: Skin is warm and dry. No rash noted.  Psychiatric: She has a normal mood and affect. Her behavior is normal.  Nursing note and vitals reviewed.    ED Treatments / Results  Labs (all labs ordered are listed, but only abnormal results are displayed) Labs Reviewed - No data to display  EKG None  Radiology No results found.  Procedures Procedures (including critical care time)  Medications Ordered in ED Medications  diphenhydrAMINE (BENADRYL) injection 25 mg (has no administration in time range)  dexamethasone (DECADRON) injection 10 mg (has no administration in time range)  metoCLOPramide (REGLAN) injection 10 mg (has no administration in time range)     Initial Impression / Assessment and Plan / ED Course  I have reviewed the triage vital signs and the nursing notes.  Pertinent labs & imaging results that were available during my care of the patient were reviewed by me and considered in my medical  decision making (see chart for details).     Patient presenting today with migraine headache and vertigo.  Patient has had the symptoms in the past.  She states she typically gets it once or twice a year.  Patient's symptoms have now been going on for 7 days.  Normally when she takes meclizine the dizziness improves but this time the headache has been severe and the meclizine is not helping the dizziness.  It is most noted when she lays down flat or tries to stand and walk.  She describes as the room spinning.  She denies any infectious symptoms or neck pain.  She has no neurologic symptoms on exam.  She denies any recent infectious symptoms.  She has normal vital signs here.  She has a normal neuro exam here as well.  Low suspicion for subarachnoid hemorrhage, stroke, venous thrombosis or IIH.  Will treat with a migraine cocktail and reevaluate  11:05 PM Pt's sx are improved after headache cocktail.  Feel that pt may be having vestibular migraine.  Will have pt f/u with pcp if sx do not resolve for further eval.  Final Clinical Impressions(s) / ED Diagnoses   Final diagnoses:  Vertigo  Intractable migraine with status migrainosus, unspecified migraine type    ED Discharge Orders    None       Blanchie Dessert, MD 11/13/17 2306

## 2017-11-13 NOTE — ED Triage Notes (Signed)
Dizziness for a week. She has been taking Meclizine with no relief. It is controlled during the day but gets bad at night when she lays down.

## 2017-11-13 NOTE — ED Notes (Signed)
ED Provider at bedside. 

## 2018-09-09 ENCOUNTER — Ambulatory Visit: Payer: 59 | Admitting: Family Medicine

## 2018-09-09 ENCOUNTER — Encounter: Payer: Self-pay | Admitting: Family Medicine

## 2018-09-09 ENCOUNTER — Other Ambulatory Visit: Payer: Self-pay

## 2018-09-09 VITALS — BP 123/79 | HR 91 | Ht 67.0 in | Wt 162.0 lb

## 2018-09-09 DIAGNOSIS — M5441 Lumbago with sciatica, right side: Secondary | ICD-10-CM | POA: Diagnosis not present

## 2018-09-09 DIAGNOSIS — M25561 Pain in right knee: Secondary | ICD-10-CM

## 2018-09-09 DIAGNOSIS — M5442 Lumbago with sciatica, left side: Secondary | ICD-10-CM

## 2018-09-09 DIAGNOSIS — M25562 Pain in left knee: Secondary | ICD-10-CM

## 2018-09-09 MED ORDER — CYCLOBENZAPRINE HCL 10 MG PO TABS: 10.0000 mg | ORAL_TABLET | Freq: Two times a day (BID) | ORAL | 0 refills | Status: DC | PRN

## 2018-09-09 MED ORDER — PENNSAID 2 % TD SOLN: 1.0000 "application " | Freq: Two times a day (BID) | TRANSDERMAL | 3 refills | Status: DC

## 2018-09-09 MED ORDER — METHYLPREDNISOLONE ACETATE 40 MG/ML IJ SUSP
40.0000 mg | Freq: Once | INTRAMUSCULAR | Status: AC
  Administered 2018-09-09: 40 mg via INTRAMUSCULAR

## 2018-09-09 MED ORDER — KETOROLAC TROMETHAMINE 30 MG/ML IJ SOLN
30.0000 mg | Freq: Once | INTRAMUSCULAR | Status: AC
  Administered 2018-09-09: 30 mg via INTRAMUSCULAR

## 2018-09-09 NOTE — Assessment & Plan Note (Signed)
Most pain seems to be localized in the lower back.  Radicular symptoms are secondary.  Likely having a spasm.  -IM Toradol and Depo-Medrol -Counseled on home exercise therapy and supportive care. -Flexeril. -Provided work note with restrictions for the next 4 weeks. -If no improvement would consider trigger point injections, imaging or physical therapy.

## 2018-09-09 NOTE — Progress Notes (Signed)
Shelby Moreno - 48 y.o. female MRN [id number]  Date of birth: January 03, 1971  SUBJECTIVE:  Including CC & ROS.  Chief Complaint  Patient presents with  . Back Pain    bilateral low back x 1 week  . Knee Pain    bilateral knee x 1 month    Shelby Moreno is a 48 y.o. female that is presenting with acute on chronic low back pain and acute on chronic left and right knee pain.  The back pain is been ongoing for about a week.  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  The pain is occurring in the lower back.  She reports some radicular symptoms down each leg.  Mainly the pain is concentrated in the lower back however.  It is sharp and stabbing.  It is painful from getting up from a seated position.  Has tried over-the-counter medications with limited improvement.  Denies any saddle anesthesia or urinary incontinence.  Reports acute on chronic bilateral knee pain.  The left knee is worse than the right.  Has been ongoing for about 1 month.  It is over the anterior aspect of each knee.  It is moderate to severe.  She feels like the knee may give out from time to time.  Is worse with going up and down stairs and walking.  It is sharp and stabbing in nature.  No improvement with modalities to date.  Is localized to the knee.  No prior surgery on the knees.  Independent review of the MRI lumbar spine from 2016 shows central disc perfusion but no significant nerve impingement.  Minor facet degeneration at L5-S1.   Review of Systems  Constitutional: Negative for fever.  HENT: Negative for congestion.   Respiratory: Negative for cough.   Cardiovascular: Negative for chest pain.  Gastrointestinal: Negative for abdominal pain.  Musculoskeletal: Positive for back pain and gait problem.  Skin: Negative for color change.  Neurological: Negative for weakness.  Hematological: Negative for adenopathy.    HISTORY: Past Medical, Surgical, Social, and Family History  Reviewed & Updated per EMR.   Pertinent Historical Findings include:  Past Medical History:  Diagnosis Date  . Anxiety   . Back pain   . Depression   . Migraine   . Vertigo     Past Surgical History:  Procedure Laterality Date  . ABDOMINAL HYSTERECTOMY    . ABLATION    . CESAREAN SECTION    . TUBAL LIGATION      Allergies  Allergen Reactions  . Augmentin [Amoxicillin-Pot Clavulanate] Hives  . Keflex [Cephalexin] Hives and Other (See Comments)    Also respiratory depression  . Naproxen Hives  . Zithromax [Azithromycin] Hives  . Morphine And Related Itching    Has to take benadryl with it    Family History  Problem Relation Age of Onset  . Diabetes Mother   . Diabetes Father   . Kidney disease Father      Social History   Socioeconomic History  . Marital status: Legally Separated    Spouse name: Not on file  . Number of children: 2  . Years of education: Not on file  . Highest education level: Not on file  Occupational History  . Occupation: Chartered certified accountant  Social Needs  . Financial resource strain: Not on file  . Food insecurity    Worry: Not on file    Inability: Not on file  . Transportation  needs    Medical: Not on file    Non-medical: Not on file  Tobacco Use  . Smoking status: Current Every Day Smoker    Packs/day: 0.25    Years: 27.00    Pack years: 6.75    Types: Cigarettes  . Smokeless tobacco: Never Used  Substance and Sexual Activity  . Alcohol use: Yes    Comment: occ  . Drug use: No  . Sexual activity: Yes    Birth control/protection: Surgical  Lifestyle  . Physical activity    Days per week: Not on file    Minutes per session: Not on file  . Stress: Not on file  Relationships  . Social Herbalist on phone: Not on file    Gets together: Not on file    Attends religious service: Not on file    Active member of club or organization: Not on file    Attends meetings of clubs or organizations: Not on file    Relationship  status: Not on file  . Intimate partner violence    Fear of current or ex partner: Not on file    Emotionally abused: Not on file    Physically abused: Not on file    Forced sexual activity: Not on file  Other Topics Concern  . Not on file  Social History Narrative  . Not on file     PHYSICAL EXAM:  VS: BP 123/79   Pulse 91   Ht 5\' 7"  (1.702 m)   Wt 162 lb (73.5 kg)   LMP 05/21/2016   BMI 25.37 kg/m  Physical Exam Gen: NAD, alert, cooperative with exam, well-appearing ENT: normal lips, normal nasal mucosa,  Eye: normal EOM, normal conjunctiva and lids CV:  no edema, +2 pedal pulses   Resp: no accessory muscle use, non-labored,   Skin: no rashes, no areas of induration  Neuro: normal tone, normal sensation to touch Psych:  normal insight, alert and oriented MSK:  Back: Tenderness to palpation over the right and left paraspinal muscles of the lumbar region. Some tenderness palpation of the midline lumbar spine. Normal flexion extension. Normal strength resistance with hip flexion, plantarflexion and dorsiflexion. Normal internal and external rotation of the hips. +2 deep tendon reflexes at the patella bilaterally. Negative straight leg raise bilaterally. Left and right knee: No obvious effusion. Some tenderness to palpation of the medial joint line. Normal range of motion. No instability. Some pain with patellar grind. Some pain with McMurray's testing bilaterally. Normal strength resistance. Neurovascular intact     ASSESSMENT & PLAN:   Bilateral knee pain She had an injury in 2016 where she fell on the patella of each knee.  Has had trouble with her knee since that time.  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  May have a component of degenerative meniscal tear.  No significant effusion today however. - IM Toradol and Depo-Medrol. -Pennsaid. -Counseled on home exercise therapy and supportive care. -If no improvement would consider injections,  imaging, physical therapy  -Could consider a hinged knee brace.  Low back pain Most pain seems to be localized in the lower back.  Radicular symptoms are secondary.  Likely having a spasm.  -IM Toradol and Depo-Medrol -Counseled on home exercise therapy and supportive care. -Flexeril. -Provided work note with restrictions for the next 4 weeks. -If no improvement would consider trigger point injections, imaging or physical therapy.

## 2018-09-09 NOTE — Assessment & Plan Note (Signed)
She had an injury in 2016 where she fell on the patella of each knee.  Has had trouble with her knee since that time.  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  May have a component of degenerative meniscal tear.  No significant effusion today however. - IM Toradol and Depo-Medrol. -Pennsaid. -Counseled on home exercise therapy and supportive care. -If no improvement would consider injections, imaging, physical therapy  -Could consider a hinged knee brace.

## 2018-09-09 NOTE — Patient Instructions (Signed)
Nice to meet you Please try the rub on medicine for the knees  Please try the exercises for the knees and back  Please try heat for the back  Please try ice for the knees  Please Aspercreme with lidocaine for the lower back. Please send me a message in MyChart with any questions or updates.  Please see me back in 4 weeks.   --Dr. Raeford Razor

## 2018-09-27 ENCOUNTER — Ambulatory Visit: Payer: 59 | Admitting: Family Medicine

## 2018-09-27 NOTE — Progress Notes (Deleted)
Shelby Moreno - 49 y.o. female MRN [id number]  Date of birth: November 23, 1970  SUBJECTIVE:  Including CC & ROS.  No chief complaint on file.   Shelby Moreno is a 48 y.o. female that is  ***.  ***   Review of Systems  HISTORY: Past Medical, Surgical, Social, and Family History Reviewed & Updated per EMR.   Pertinent Historical Findings include:  Past Medical History:  Diagnosis Date  . Anxiety   . Back pain   . Depression   . Migraine   . Vertigo     Past Surgical History:  Procedure Laterality Date  . ABDOMINAL HYSTERECTOMY    . ABLATION    . CESAREAN SECTION    . TUBAL LIGATION      Allergies  Allergen Reactions  . Augmentin [Amoxicillin-Pot Clavulanate] Hives  . Keflex [Cephalexin] Hives and Other (See Comments)    Also respiratory depression  . Naproxen Hives  . Zithromax [Azithromycin] Hives  . Morphine And Related Itching    Has to take benadryl with it    Family History  Problem Relation Age of Onset  . Diabetes Mother   . Diabetes Father   . Kidney disease Father      Social History   Socioeconomic History  . Marital status: Legally Separated    Spouse name: Not on file  . Number of children: 2  . Years of education: Not on file  . Highest education level: Not on file  Occupational History  . Occupation: Chartered certified accountant  Social Needs  . Financial resource strain: Not on file  . Food insecurity    Worry: Not on file    Inability: Not on file  . Transportation needs    Medical: Not on file    Non-medical: Not on file  Tobacco Use  . Smoking status: Current Every Day Smoker    Packs/day: 0.25    Years: 27.00    Pack years: 6.75    Types: Cigarettes  . Smokeless tobacco: Never Used  Substance and Sexual Activity  . Alcohol use: Yes    Comment: occ  . Drug use: No  . Sexual activity: Yes    Birth control/protection: Surgical  Lifestyle  . Physical activity    Days per week: Not on file    Minutes per session: Not on file  . Stress: Not  on file  Relationships  . Social Herbalist on phone: Not on file    Gets together: Not on file    Attends religious service: Not on file    Active member of club or organization: Not on file    Attends meetings of clubs or organizations: Not on file    Relationship status: Not on file  . Intimate partner violence    Fear of current or ex partner: Not on file    Emotionally abused: Not on file    Physically abused: Not on file    Forced sexual activity: Not on file  Other Topics Concern  . Not on file  Social History Narrative  . Not on file     PHYSICAL EXAM:  VS: LMP 05/21/2016  Physical Exam Gen: NAD, alert, cooperative with exam, well-appearing ENT: normal lips, normal nasal mucosa,  Eye: normal EOM, normal conjunctiva and lids CV:  no edema, +2 pedal pulses   Resp: no accessory muscle use, non-labored,  GI: no masses or tenderness, no hernia  Skin: no rashes, no areas of induration  Neuro:  normal tone, normal sensation to touch Psych:  normal insight, alert and oriented MSK:  ***      ASSESSMENT & PLAN:   No problem-specific Assessment & Plan notes found for this encounter.

## 2018-10-07 ENCOUNTER — Other Ambulatory Visit: Payer: Self-pay

## 2018-10-07 ENCOUNTER — Ambulatory Visit (INDEPENDENT_AMBULATORY_CARE_PROVIDER_SITE_OTHER): Payer: 59 | Admitting: Family Medicine

## 2018-10-07 ENCOUNTER — Encounter: Payer: Self-pay | Admitting: Family Medicine

## 2018-10-07 DIAGNOSIS — M25562 Pain in left knee: Secondary | ICD-10-CM

## 2018-10-07 DIAGNOSIS — G8929 Other chronic pain: Secondary | ICD-10-CM | POA: Diagnosis not present

## 2018-10-07 DIAGNOSIS — M5441 Lumbago with sciatica, right side: Secondary | ICD-10-CM | POA: Diagnosis not present

## 2018-10-07 DIAGNOSIS — M25561 Pain in right knee: Secondary | ICD-10-CM

## 2018-10-07 MED ORDER — PREDNISONE 5 MG PO TABS: ORAL_TABLET | ORAL | 0 refills | Status: DC

## 2018-10-07 NOTE — Patient Instructions (Signed)
Good to see you Please try the prednisone  Please try heat on the lower back  Please continue the home exercises   Please send me a message in MyChart with any questions or updates.  Please see me back in 4 weeks.   --Dr. Raeford Razor

## 2018-10-07 NOTE — Progress Notes (Addendum)
Shelby Moreno - 48 y.o. female MRN [id number]  Date of birth: 1970-05-12  SUBJECTIVE:  Including CC & ROS.  Chief Complaint  Patient presents with  . Follow-up    follow up for low back and bilateral knee    Shelby Moreno is a 48 y.o. female that is following up for her back and bilateral knee pain.  She reports her knee pain having significant improvement.  She feels that her back pain has gotten worse.  She feels she is able to do her job with no restrictions that were provided.  This does help her back while she is at work.  The pain is still occurring.  She feels the pain in the upper thoracic back as well today.  She is also having some sciatic type symptoms down the right leg.  The pain is intermittent in nature.  She is unsure of what is most triggered by.  Pain can be sharp and stabbing.  Does get some relief with the muscle relaxer..    Review of Systems  Constitutional: Negative for fever.  HENT: Negative for congestion.   Respiratory: Negative for cough.   Cardiovascular: Negative for chest pain.  Gastrointestinal: Negative for abdominal pain.  Musculoskeletal: Positive for back pain.  Neurological: Negative for weakness.  Hematological: Negative for adenopathy.    HISTORY: Past Medical, Surgical, Social, and Family History Reviewed & Updated per EMR.   Pertinent Historical Findings include:  Past Medical History:  Diagnosis Date  . Anxiety   . Back pain   . Depression   . Migraine   . Vertigo     Past Surgical History:  Procedure Laterality Date  . ABDOMINAL HYSTERECTOMY    . ABLATION    . CESAREAN SECTION    . TUBAL LIGATION      Allergies  Allergen Reactions  . Augmentin [Amoxicillin-Pot Clavulanate] Hives  . Keflex [Cephalexin] Hives and Other (See Comments)    Also respiratory depression  . Naproxen Hives  . Zithromax [Azithromycin] Hives  . Morphine And Related Itching    Has to take benadryl with it    Family History  Problem Relation Age of  Onset  . Diabetes Mother   . Diabetes Father   . Kidney disease Father      Social History   Socioeconomic History  . Marital status: Legally Separated    Spouse name: Not on file  . Number of children: 2  . Years of education: Not on file  . Highest education level: Not on file  Occupational History  . Occupation: Chartered certified accountant  Social Needs  . Financial resource strain: Not on file  . Food insecurity    Worry: Not on file    Inability: Not on file  . Transportation needs    Medical: Not on file    Non-medical: Not on file  Tobacco Use  . Smoking status: Current Every Day Smoker    Packs/day: 0.25    Years: 27.00    Pack years: 6.75    Types: Cigarettes  . Smokeless tobacco: Never Used  Substance and Sexual Activity  . Alcohol use: Yes    Comment: occ  . Drug use: No  . Sexual activity: Yes    Birth control/protection: Surgical  Lifestyle  . Physical activity    Days per week: Not on file    Minutes per session: Not on file  . Stress: Not on file  Relationships  . Social Herbalist on  phone: Not on file    Gets together: Not on file    Attends religious service: Not on file    Active member of club or organization: Not on file    Attends meetings of clubs or organizations: Not on file    Relationship status: Not on file  . Intimate partner violence    Fear of current or ex partner: Not on file    Emotionally abused: Not on file    Physically abused: Not on file    Forced sexual activity: Not on file  Other Topics Concern  . Not on file  Social History Narrative  . Not on file     PHYSICAL EXAM:  VS: BP 114/78   Ht 5\' 6"  (1.676 m)   Wt 164 lb (74.4 kg)   LMP 05/21/2016   BMI 26.47 kg/m  Physical Exam Gen: NAD, alert, cooperative with exam, well-appearing ENT: normal lips, normal nasal mucosa,  Eye: normal EOM, normal conjunctiva and lids CV:  no edema, +2 pedal pulses   Resp: no accessory muscle use, non-labored,  Skin: no rashes, no  areas of induration  Neuro: normal tone, normal sensation to touch Psych:  normal insight, alert and oriented MSK:  Back: Normal flexion extension. Normal strength resistance with hip flexion, knee flexion extension, plantarflexion and dorsiflexion. Negative straight leg raise. Neurovascular intact     ASSESSMENT & PLAN:   Bilateral knee pain Has had relief of her symptoms. -Counseled on supportive care and home exercise therapy.  Low back pain Symptoms are ongoing.  Had some resolution with Toradol and Depo-medrol injection.  Having pain that is more sciatic in nature down the right leg.  Also having some thoracic back pain as well.  Unclear if spasm related to degenerative changes.  Less likely for polymyalgia.  Unclear if related to fibro myalgia. -Prednisone. -Provided work note. -Counseled on home exercise therapy and supportive care. -Physical therapy made her symptoms worse so she is reluctant. -If no improvement will consider MRI for epidural or facet injections.

## 2018-10-07 NOTE — Assessment & Plan Note (Signed)
Has had relief of her symptoms. -Counseled on supportive care and home exercise therapy.

## 2018-10-07 NOTE — Assessment & Plan Note (Addendum)
Symptoms are ongoing.  Had some resolution with Toradol and Depo-medrol injection.  Having pain that is more sciatic in nature down the right leg.  Also having some thoracic back pain as well.  Unclear if spasm related to degenerative changes.  Less likely for polymyalgia.  Unclear if related to fibro myalgia. -Prednisone. -Provided work note. -Counseled on home exercise therapy and supportive care. -Physical therapy made her symptoms worse so she is reluctant. -If no improvement will consider MRI for epidural or facet injections.

## 2018-11-04 ENCOUNTER — Encounter: Payer: Self-pay | Admitting: Family Medicine

## 2018-11-04 ENCOUNTER — Ambulatory Visit (INDEPENDENT_AMBULATORY_CARE_PROVIDER_SITE_OTHER): Payer: 59 | Admitting: Family Medicine

## 2018-11-04 ENCOUNTER — Other Ambulatory Visit: Payer: Self-pay

## 2018-11-04 ENCOUNTER — Ambulatory Visit (HOSPITAL_BASED_OUTPATIENT_CLINIC_OR_DEPARTMENT_OTHER)
Admission: RE | Admit: 2018-11-04 | Discharge: 2018-11-04 | Disposition: A | Discharge status: Home / Self Care | Payer: 59 | Source: Ambulatory Visit | Attending: Family Medicine | Admitting: Family Medicine

## 2018-11-04 ENCOUNTER — Ambulatory Visit: Payer: Self-pay

## 2018-11-04 VITALS — BP 137/95 | Ht 67.0 in | Wt 162.0 lb

## 2018-11-04 DIAGNOSIS — M25562 Pain in left knee: Secondary | ICD-10-CM | POA: Insufficient documentation

## 2018-11-04 DIAGNOSIS — G8929 Other chronic pain: Secondary | ICD-10-CM | POA: Diagnosis not present

## 2018-11-04 DIAGNOSIS — M25561 Pain in right knee: Secondary | ICD-10-CM

## 2018-11-04 DIAGNOSIS — M5441 Lumbago with sciatica, right side: Secondary | ICD-10-CM | POA: Diagnosis present

## 2018-11-04 MED ORDER — TRIAMCINOLONE ACETONIDE 40 MG/ML IJ SUSP
40.0000 mg | Freq: Once | INTRAMUSCULAR | Status: AC
  Administered 2018-11-04: 40 mg via INTRA_ARTICULAR

## 2018-11-04 NOTE — Patient Instructions (Signed)
Good to see you Please try ice  Please continue the exercises for the back and knees  I will call you with the results from today.  Please send me a message in MyChart with any questions or updates.  We will schedule a virtual visit once the MRI results are back.  Please see me back in 4 weeks.   --Dr. Raeford Razor

## 2018-11-04 NOTE — Assessment & Plan Note (Signed)
Pain is exacerbating as of late.  Has received epidurals in the past which seemed improved her symptoms.  Has intermittent sciatic type symptoms. -X-ray. -MRI to evaluate for facet arthritis for injections or herniation.

## 2018-11-04 NOTE — Progress Notes (Signed)
EMILEA KONOP - 48 y.o. female MRN [id number]  Date of birth: [birth year]  SUBJECTIVE:  Including CC & ROS.  Chief Complaint  Patient presents with  . Follow-up    follow up for low back and bilateral knee    DELMY GABA is a 48 y.o. female that is presenting with acute left knee pain.  And worsening back pain.  She has had limitations with work.  Her symptoms seem to be worse as of late.  The left knee seems to be occurring on the lateral aspect.  The pain is intermittent in nature.  It is worse if she does a lot of walking.  Denies any pain if she is sitting down.  Has tried conservative measures with limited improvement.  Has tried physical therapy in the past with limited improvement.  No redness or ecchymosis.  The low back pain is occurring over the right and left lower back.  It travels from one side to the other.  Has tried epidurals in the past that seem to improve her symptoms.  Has some sciatic type symptoms down the left side.  Pain is moderate to severe.  Pain is intermittent in nature.   Review of Systems  Constitutional: Negative for fever.  HENT: Negative for congestion.   Respiratory: Negative for cough.   Cardiovascular: Negative for chest pain.  Gastrointestinal: Negative for abdominal pain.  Musculoskeletal: Positive for back pain and gait problem.  Neurological: Negative for weakness.  Hematological: Negative for adenopathy.    HISTORY: Past Medical, Surgical, Social, and Family History Reviewed & Updated per EMR.   Pertinent Historical Findings include:  Past Medical History:  Diagnosis Date  . Anxiety   . Back pain   . Depression   . Migraine   . Vertigo     Past Surgical History:  Procedure Laterality Date  . ABDOMINAL HYSTERECTOMY    . ABLATION    . CESAREAN SECTION    . TUBAL LIGATION      Allergies  Allergen Reactions  . Augmentin [Amoxicillin-Pot Clavulanate] Hives  . Keflex [Cephalexin] Hives and Other (See Comments)    Also respiratory  depression  . Naproxen Hives  . Zithromax [Azithromycin] Hives  . Morphine And Related Itching    Has to take benadryl with it    Family History  Problem Relation Age of Onset  . Diabetes Mother   . Diabetes Father   . Kidney disease Father      Social History   Socioeconomic History  . Marital status: Legally Separated    Spouse name: Not on file  . Number of children: 2  . Years of education: Not on file  . Highest education level: Not on file  Occupational History  . Occupation: Chartered certified accountant  Social Needs  . Financial resource strain: Not on file  . Food insecurity    Worry: Not on file    Inability: Not on file  . Transportation needs    Medical: Not on file    Non-medical: Not on file  Tobacco Use  . Smoking status: Current Every Day Smoker    Packs/day: 0.25    Years: 27.00    Pack years: 6.75    Types: Cigarettes  . Smokeless tobacco: Never Used  Substance and Sexual Activity  . Alcohol use: Yes    Comment: occ  . Drug use: No  . Sexual activity: Yes    Birth control/protection: Surgical  Lifestyle  . Physical activity    Days  per week: Not on file    Minutes per session: Not on file  . Stress: Not on file  Relationships  . Social Herbalist on phone: Not on file    Gets together: Not on file    Attends religious service: Not on file    Active member of club or organization: Not on file    Attends meetings of clubs or organizations: Not on file    Relationship status: Not on file  . Intimate partner violence    Fear of current or ex partner: Not on file    Emotionally abused: Not on file    Physically abused: Not on file    Forced sexual activity: Not on file  Other Topics Concern  . Not on file  Social History Narrative  . Not on file     PHYSICAL EXAM:  VS: BP (!) 137/95   Ht 5\' 7"  (1.702 m)   Wt 162 lb (73.5 kg)   LMP 05/21/2016   BMI 25.37 kg/m  Physical Exam Gen: NAD, alert, cooperative with exam, well-appearing ENT:  normal lips, normal nasal mucosa,  Eye: normal EOM, normal conjunctiva and lids CV:  no edema, +2 pedal pulses   Resp: no accessory muscle use, non-labored,  Skin: no rashes, no areas of induration  Neuro: normal tone, normal sensation to touch Psych:  normal insight, alert and oriented MSK:  Left knee:  No effusion  Normal ROM  Normal strength to resistance  Positive McMurray's test. No pain with patellar grind. Back:  No midline lumbar TTP  Normal IR and ER of the hips  Positive SLR  NVI    Aspiration/Injection Procedure Note RUTHELMA BESCH 05/03/1970  Procedure: Injection Indications: left knee pain   Procedure Details Consent: Risks of procedure as well as the alternatives and risks of each were explained to the (patient/caregiver).  Consent for procedure obtained. Time Out: Verified patient identification, verified procedure, site/side was marked, verified correct patient position, special equipment/implants available, medications/allergies/relevent history reviewed, required imaging and test results available.  Performed.  The area was cleaned with iodine and alcohol swabs.    The left knee superior lateral suprapatellar pouch was injected using 1 cc's of 40 mg Kenalog and 4 cc's of 0.25% bupivacaine with a 22 1 1/2" needle.  Ultrasound was used. Images were obtained in long views showing the injection.     A sterile dressing was applied.  Patient did tolerate procedure well.    ASSESSMENT & PLAN:   Low back pain Pain is exacerbating as of late.  Has received epidurals in the past which seemed improved her symptoms.  Has intermittent sciatic type symptoms. -X-ray. -MRI to evaluate for facet arthritis for injections or herniation.   Bilateral knee pain Pain seems to be associated with degenerative meniscus.  Possible to have patellofemoral component as well. -Injection. -X-ray. -Counseled on home exercise therapy and supportive care. -Work note provided.

## 2018-11-04 NOTE — Assessment & Plan Note (Signed)
Pain seems to be associated with degenerative meniscus.  Possible to have patellofemoral component as well. -Injection. -X-ray. -Counseled on home exercise therapy and supportive care. -Work note provided.

## 2018-11-08 ENCOUNTER — Telehealth: Payer: Self-pay | Admitting: Family Medicine

## 2018-11-08 NOTE — Telephone Encounter (Signed)
Left VM for patient. If she calls back please have her speak with a nurse/CMA and inform that her knee xrays shows mild degenerative changes. The lumbar spine also shows mild degenerative changes.   If any questions then please take the best time and phone number to call and I will try to call her back.   Rosemarie Ax, MD [facility] 11/08/2018, 8:31 AM

## 2018-11-08 NOTE — Telephone Encounter (Signed)
Spoke to patient and gave her x-ray results.

## 2018-11-08 NOTE — Telephone Encounter (Signed)
Patient returning call for xray results  

## 2018-11-29 ENCOUNTER — Ambulatory Visit
Admission: RE | Admit: 2018-11-29 | Discharge: 2018-11-29 | Disposition: A | Discharge status: Home / Self Care | Payer: 59 | Source: Ambulatory Visit | Attending: Family Medicine | Admitting: Family Medicine

## 2018-11-29 DIAGNOSIS — G8929 Other chronic pain: Secondary | ICD-10-CM

## 2018-12-02 ENCOUNTER — Ambulatory Visit: Payer: 59 | Admitting: Family Medicine

## 2018-12-03 ENCOUNTER — Other Ambulatory Visit: Payer: Self-pay

## 2018-12-03 ENCOUNTER — Ambulatory Visit: Payer: 59 | Admitting: Family Medicine

## 2018-12-06 ENCOUNTER — Other Ambulatory Visit: Payer: Self-pay

## 2018-12-06 ENCOUNTER — Ambulatory Visit: Payer: 59 | Admitting: Family Medicine

## 2018-12-06 ENCOUNTER — Encounter: Payer: Self-pay | Admitting: Family Medicine

## 2018-12-06 VITALS — BP 148/93 | HR 81 | Ht 66.0 in | Wt 164.0 lb

## 2018-12-06 DIAGNOSIS — M23203 Derangement of unspecified medial meniscus due to old tear or injury, right knee: Secondary | ICD-10-CM

## 2018-12-06 DIAGNOSIS — M5441 Lumbago with sciatica, right side: Secondary | ICD-10-CM | POA: Diagnosis not present

## 2018-12-06 DIAGNOSIS — G8929 Other chronic pain: Secondary | ICD-10-CM | POA: Diagnosis not present

## 2018-12-06 DIAGNOSIS — M23204 Derangement of unspecified medial meniscus due to old tear or injury, left knee: Secondary | ICD-10-CM

## 2018-12-06 MED ORDER — HYDROCODONE-ACETAMINOPHEN 5-325 MG PO TABS: 1.0000 | ORAL_TABLET | Freq: Two times a day (BID) | ORAL | 0 refills | Status: DC | PRN

## 2018-12-06 NOTE — Patient Instructions (Signed)
Good to see you Please try ice   Please do not drive or work with the pain medication and only use it with severe pain.  Please send me a message in MyChart with any questions or updates.  We will set up an appointment after the knee MRI.   --Dr. Raeford Razor

## 2018-12-06 NOTE — Assessment & Plan Note (Signed)
Discuss the findings of her MRI. She will discuss with her sons about the next step.  - counseled on the different treatment strategies  - consider epidural or referral to surgery - work note provided with limitations.

## 2018-12-06 NOTE — Assessment & Plan Note (Signed)
Pain is acutely worsened despite conservative measures.  - MRI to evaluate for chondral lesion or worsening meniscus tear  - norco for severe pain. Counseled on its use

## 2018-12-06 NOTE — Progress Notes (Signed)
Shelby Moreno - 48 y.o. female MRN [id number]  Date of birth: [birth year]  SUBJECTIVE:  Including CC & ROS.  Chief Complaint  Patient presents with  . Follow-up    follow up for bilateral knee   . Follow-up    follow up for low back - 6/10    Shelby Moreno is a 48 y.o. female that is  Following up for her MRI lumbar spine and her bilateral knee pain. The MRI of her lumbar region shows the same protrusion that her MRI from 2016 showed. She had tried epidural injection at that time.   The knee pain is ongoing despite conservative care. Has tried a steroid injection in the left knee with minimal relief. The left knee is severe and the right knee is mild. The pain is localized to the knee. It is worse with prolonged standing and going up or down stairs. The pain is intermittent. Denies mechanical symptoms.   Review of Systems  Constitutional: Negative for fever.  HENT: Negative for congestion.   Respiratory: Negative for cough.   Cardiovascular: Negative for chest pain.  Gastrointestinal: Negative for abdominal pain.  Musculoskeletal: Positive for back pain and gait problem.  Skin: Negative for color change.  Neurological: Negative for weakness.  Hematological: Negative for adenopathy.    HISTORY: Past Medical, Surgical, Social, and Family History Reviewed & Updated per EMR.   Pertinent Historical Findings include:  Past Medical History:  Diagnosis Date  . Anxiety   . Back pain   . Depression   . Migraine   . Vertigo     Past Surgical History:  Procedure Laterality Date  . ABDOMINAL HYSTERECTOMY    . ABLATION    . CESAREAN SECTION    . TUBAL LIGATION      Allergies  Allergen Reactions  . Augmentin [Amoxicillin-Pot Clavulanate] Hives  . Keflex [Cephalexin] Hives and Other (See Comments)    Also respiratory depression  . Naproxen Hives  . Zithromax [Azithromycin] Hives  . Morphine And Related Itching    Has to take benadryl with it    Family History  Problem  Relation Age of Onset  . Diabetes Mother   . Diabetes Father   . Kidney disease Father      Social History   Socioeconomic History  . Marital status: Legally Separated    Spouse name: Not on file  . Number of children: 2  . Years of education: Not on file  . Highest education level: Not on file  Occupational History  . Occupation: Chartered certified accountant  Social Needs  . Financial resource strain: Not on file  . Food insecurity    Worry: Not on file    Inability: Not on file  . Transportation needs    Medical: Not on file    Non-medical: Not on file  Tobacco Use  . Smoking status: Current Every Day Smoker    Packs/day: 0.25    Years: 27.00    Pack years: 6.75    Types: Cigarettes  . Smokeless tobacco: Never Used  Substance and Sexual Activity  . Alcohol use: Yes    Comment: occ  . Drug use: No  . Sexual activity: Yes    Birth control/protection: Surgical  Lifestyle  . Physical activity    Days per week: Not on file    Minutes per session: Not on file  . Stress: Not on file  Relationships  . Social connections    Talks on phone: Not on file  Gets together: Not on file    Attends religious service: Not on file    Active member of club or organization: Not on file    Attends meetings of clubs or organizations: Not on file    Relationship status: Not on file  . Intimate partner violence    Fear of current or ex partner: Not on file    Emotionally abused: Not on file    Physically abused: Not on file    Forced sexual activity: Not on file  Other Topics Concern  . Not on file  Social History Narrative  . Not on file     PHYSICAL EXAM:  VS: BP (!) 148/93   Pulse 81   Ht 5\' 6"  (1.676 m)   Wt 164 lb (74.4 kg)   LMP 05/21/2016   BMI 26.47 kg/m  Physical Exam Gen: NAD, alert, cooperative with exam, well-appearing ENT: normal lips, normal nasal mucosa,  Eye: normal EOM, normal conjunctiva and lids CV:  no edema, +2 pedal pulses   Resp: no accessory muscle use,  non-labored,  Skin: no rashes, no areas of induration  Neuro: normal tone, normal sensation to touch Psych:  normal insight, alert and oriented MSK:  Left Knee: Normal to inspection with no erythema or effusion or obvious bony abnormalities. Palpation normal with no warmth, patellar tenderness, or condyle tenderness. TTP of the medial joint line ROM full in flexion and extension and lower leg rotation. Ligaments with solid consistent endpoints including LCL, MCL. Positive Mcmurray's  tests. Non painful patellar compression. Patellar glide without crepitus. Patellar and quadriceps tendons unremarkable. Hamstring and quadriceps strength is normal.  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  - MRI to evaluate for chondral lesion or worsening meniscus tear  - norco for severe pain. Counseled on its use    Low back pain Discuss the findings of her MRI. She will discuss with her sons about the next step.  - counseled on the different treatment strategies  - consider epidural or referral to surgery - work note provided with limitations.

## 2018-12-15 ENCOUNTER — Telehealth: Payer: Self-pay | Admitting: Family Medicine

## 2018-12-15 DIAGNOSIS — G8929 Other chronic pain: Secondary | ICD-10-CM

## 2018-12-15 NOTE — Telephone Encounter (Signed)
Patient has decided she would like to try the epidural injections for her back pain

## 2018-12-18 ENCOUNTER — Other Ambulatory Visit: Payer: Self-pay

## 2018-12-18 ENCOUNTER — Ambulatory Visit
Admission: RE | Admit: 2018-12-18 | Discharge: 2018-12-18 | Disposition: A | Discharge status: Home / Self Care | Payer: 59 | Source: Ambulatory Visit | Attending: Family Medicine | Admitting: Family Medicine

## 2018-12-18 DIAGNOSIS — M23204 Derangement of unspecified medial meniscus due to old tear or injury, left knee: Secondary | ICD-10-CM

## 2018-12-18 DIAGNOSIS — M23203 Derangement of unspecified medial meniscus due to old tear or injury, right knee: Secondary | ICD-10-CM

## 2018-12-20 ENCOUNTER — Ambulatory Visit (INDEPENDENT_AMBULATORY_CARE_PROVIDER_SITE_OTHER): Payer: 59 | Admitting: Family Medicine

## 2018-12-20 ENCOUNTER — Other Ambulatory Visit: Payer: Self-pay

## 2018-12-20 DIAGNOSIS — M23204 Derangement of unspecified medial meniscus due to old tear or injury, left knee: Secondary | ICD-10-CM

## 2018-12-20 DIAGNOSIS — M23203 Derangement of unspecified medial meniscus due to old tear or injury, right knee: Secondary | ICD-10-CM

## 2018-12-20 DIAGNOSIS — M5441 Lumbago with sciatica, right side: Secondary | ICD-10-CM

## 2018-12-20 DIAGNOSIS — G8929 Other chronic pain: Secondary | ICD-10-CM

## 2018-12-20 MED ORDER — CYCLOBENZAPRINE HCL 10 MG PO TABS: 10.0000 mg | ORAL_TABLET | Freq: Two times a day (BID) | ORAL | 0 refills | Status: AC | PRN

## 2018-12-20 NOTE — Assessment & Plan Note (Signed)
MRI of left knee was demonstrating fissuring which may be the source of her pain. Pain may also be PF syndrome in nature.  - counseled on HEP and supportive care - will try gel injections.

## 2018-12-20 NOTE — Assessment & Plan Note (Signed)
Acute exacerbation of her low back pain. Waiting on epidural.  - provided flexeril  - will call to set up epidural injection

## 2018-12-20 NOTE — Progress Notes (Signed)
Virtual Visit via Video Note  I connected with Shelby Moreno on 12/20/18 at  1:50 PM EST by a video enabled telemedicine application and verified that I am speaking with the correct person using two identifiers.   I discussed the limitations of evaluation and management by telemedicine and the availability of in person appointments. The patient expressed understanding and agreed to proceed.  History of Present Illness:  Shelby Moreno is a 48 yo F that is following up for her ongoing low back and left knee pain. Her MRI of the left knee was showing cartilage fissuring of the lateral patellar facet. She had to stay home today due to her pain. Has been taking flexeril to help with the pain.     Observations/Objective:  Gen: NAD, alert, cooperative with exam, well-appearing ENT: normal lips, normal nasal mucosa,  Eye: normal EOM, normal conjunctiva and lids Resp: no accessory muscle use, non-labored,  Psych:  normal insight, alert and oriented   Assessment and Plan:  Low back pain:  Acute exacerbation of her low back pain. Waiting on epidural.  - provided flexeril  - will call to set up epidural injection   Left knee pain:  MRI of left knee was demonstrating fissuring which may be the source of her pain. Pain may also be PF syndrome in nature.  - counseled on HEP and supportive care - will try gel injections.   Follow Up Instructions:    I discussed the assessment and treatment plan with the patient. The patient was provided an opportunity to ask questions and all were answered. The patient agreed with the plan and demonstrated an understanding of the instructions.   The patient was advised to call back or seek an in-person evaluation if the symptoms worsen or if the condition fails to improve as anticipated.   Clearance Coots, MD

## 2018-12-24 ENCOUNTER — Ambulatory Visit
Admission: RE | Admit: 2018-12-24 | Discharge: 2018-12-24 | Disposition: A | Discharge status: Home / Self Care | Payer: 59 | Source: Ambulatory Visit | Attending: Family Medicine | Admitting: Family Medicine

## 2018-12-24 ENCOUNTER — Other Ambulatory Visit: Payer: Self-pay

## 2018-12-24 DIAGNOSIS — G8929 Other chronic pain: Secondary | ICD-10-CM

## 2018-12-24 MED ORDER — METHYLPREDNISOLONE ACETATE 40 MG/ML INJ SUSP (RADIOLOG
120.0000 mg | Freq: Once | INTRAMUSCULAR | Status: AC
  Administered 2018-12-24: 10:00:00 120 mg via EPIDURAL

## 2018-12-24 MED ORDER — IOPAMIDOL (ISOVUE-M 200) INJECTION 41%
1.0000 mL | Freq: Once | INTRAMUSCULAR | Status: AC
  Administered 2018-12-24: 1 mL via EPIDURAL

## 2018-12-24 NOTE — Discharge Instructions (Signed)

## 2018-12-27 ENCOUNTER — Encounter: Payer: Self-pay | Admitting: Family Medicine

## 2018-12-27 ENCOUNTER — Ambulatory Visit: Payer: Self-pay

## 2018-12-27 ENCOUNTER — Ambulatory Visit (INDEPENDENT_AMBULATORY_CARE_PROVIDER_SITE_OTHER): Payer: 59 | Admitting: Family Medicine

## 2018-12-27 ENCOUNTER — Other Ambulatory Visit: Payer: Self-pay

## 2018-12-27 VITALS — BP 124/82 | HR 83 | Ht 66.0 in | Wt 165.0 lb

## 2018-12-27 DIAGNOSIS — M25562 Pain in left knee: Secondary | ICD-10-CM | POA: Diagnosis not present

## 2018-12-27 DIAGNOSIS — G8929 Other chronic pain: Secondary | ICD-10-CM | POA: Diagnosis not present

## 2018-12-27 DIAGNOSIS — M2242 Chondromalacia patellae, left knee: Secondary | ICD-10-CM | POA: Diagnosis not present

## 2018-12-27 NOTE — Progress Notes (Signed)
Shelby Moreno - 48 y.o. female MRN [id number]  Date of birth: 1970/06/15  SUBJECTIVE:  Including CC & ROS.  Chief Complaint  Patient presents with  . Knee Pain    left knee    Shelby Moreno is a 48 y.o. female that is  Following up for her left knee pain. Pain has been ongoing despite measures thus far. Mild improvement with CSI.  Review of the left knee MRI from 11/7 shows cartilage fissuring of the lateral patellar facet.   Review of Systems  HISTORY: Past Medical, Surgical, Social, and Family History Reviewed & Updated per EMR.   Pertinent Historical Findings include:  Past Medical History:  Diagnosis Date  . Anxiety   . Back pain   . Depression   . Migraine   . Vertigo     Past Surgical History:  Procedure Laterality Date  . ABDOMINAL HYSTERECTOMY    . ABLATION    . CESAREAN SECTION    . TUBAL LIGATION      Allergies  Allergen Reactions  . Augmentin [Amoxicillin-Pot Clavulanate] Hives  . Keflex [Cephalexin] Hives and Other (See Comments)    Also respiratory depression  . Naproxen Hives  . Zithromax [Azithromycin] Hives  . Morphine And Related Itching    Has to take benadryl with it    Family History  Problem Relation Age of Onset  . Diabetes Mother   . Diabetes Father   . Kidney disease Father      Social History   Socioeconomic History  . Marital status: Legally Separated    Spouse name: Not on file  . Number of children: 2  . Years of education: Not on file  . Highest education level: Not on file  Occupational History  . Occupation: Chartered certified accountant  Social Needs  . Financial resource strain: Not on file  . Food insecurity    Worry: Not on file    Inability: Not on file  . Transportation needs    Medical: Not on file    Non-medical: Not on file  Tobacco Use  . Smoking status: Current Every Day Smoker    Packs/day: 0.25    Years: 27.00    Pack years: 6.75    Types: Cigarettes  . Smokeless tobacco: Never Used  Substance and Sexual  Activity  . Alcohol use: Yes    Comment: occ  . Drug use: No  . Sexual activity: Yes    Birth control/protection: Surgical  Lifestyle  . Physical activity    Days per week: Not on file    Minutes per session: Not on file  . Stress: Not on file  Relationships  . Social Herbalist on phone: Not on file    Gets together: Not on file    Attends religious service: Not on file    Active member of club or organization: Not on file    Attends meetings of clubs or organizations: Not on file    Relationship status: Not on file  . Intimate partner violence    Fear of current or ex partner: Not on file    Emotionally abused: Not on file    Physically abused: Not on file    Forced sexual activity: Not on file  Other Topics Concern  . Not on file  Social History Narrative  . Not on file     PHYSICAL EXAM:  VS: BP 124/82   Pulse 83   Ht 5\' 6"  (1.676 m)  Wt 165 lb (74.8 kg)   LMP 05/21/2016   BMI 26.63 kg/m  Physical Exam Gen: NAD, alert, cooperative with exam, well-appearing    Aspiration/Injection Procedure Note Shelby Moreno August 25, 1970  Procedure: Injection Indications: left knee pain   Procedure Details Consent: Risks of procedure as well as the alternatives and risks of each were explained to the (patient/caregiver).  Consent for procedure obtained. Time Out: Verified patient identification, verified procedure, site/side was marked, verified correct patient position, special equipment/implants available, medications/allergies/relevent history reviewed, required imaging and test results available.  Performed.  The area was cleaned with iodine and alcohol swabs.    The left knee superior lateral suprapatellar pouch was injected using 4 cc's of 1% lidocaine with a 22 1 1/2" needle.  The syringe was switched and a 16.8 mg/2 mL of Gelsyn 3 was injected. Ultrasound was used. Images were obtained in  Long views showing the injection.     A sterile dressing was  applied.  Patient did tolerate procedure well.        ASSESSMENT & PLAN:   Chondromalacia of left patella Pain is ongoing. Has limited improvement CSI.  [name] 1/3 - counseled on supportive  - f/u in one week.

## 2018-12-27 NOTE — Assessment & Plan Note (Signed)
Pain is ongoing. Has limited improvement CSI.  Shelby Moreno 1/3 - counseled on supportive  - f/u in one week.

## 2018-12-27 NOTE — Patient Instructions (Signed)
Good to see you Please try ice  Please send me a message in MyChart with any questions or updates.  Please see me back in one week.   --Dr. Raeford Razor

## 2019-01-04 ENCOUNTER — Ambulatory Visit: Payer: Self-pay

## 2019-01-04 ENCOUNTER — Encounter: Payer: Self-pay | Admitting: Family Medicine

## 2019-01-04 ENCOUNTER — Ambulatory Visit: Payer: 59 | Admitting: Family Medicine

## 2019-01-04 ENCOUNTER — Other Ambulatory Visit: Payer: Self-pay

## 2019-01-04 VITALS — BP 143/84 | Ht 67.0 in | Wt 165.0 lb

## 2019-01-04 DIAGNOSIS — M2242 Chondromalacia patellae, left knee: Secondary | ICD-10-CM

## 2019-01-04 NOTE — Patient Instructions (Signed)
Good to see you Please try ice  Please send me a message in MyChart with any questions or updates.  Please see me back in 1 week.   --Dr. Raeford Razor

## 2019-01-04 NOTE — Assessment & Plan Note (Signed)
Completed 2/3 of Gelsyn injections  - f/u in one week.

## 2019-01-04 NOTE — Progress Notes (Signed)
Shelby Moreno - 48 y.o. female MRN [id number]  Date of birth: 1970-12-20  SUBJECTIVE:  Including CC & ROS.  No chief complaint on file.   Shelby Moreno is a 48 y.o. female that is following up for her second Gelsyn injection   Review of Systems  HISTORY: Past Medical, Surgical, Social, and Family History Reviewed & Updated per EMR.   Pertinent Historical Findings include:  Past Medical History:  Diagnosis Date  . Anxiety   . Back pain   . Depression   . Migraine   . Vertigo     Past Surgical History:  Procedure Laterality Date  . ABDOMINAL HYSTERECTOMY    . ABLATION    . CESAREAN SECTION    . TUBAL LIGATION      Allergies  Allergen Reactions  . Augmentin [Amoxicillin-Pot Clavulanate] Hives  . Keflex [Cephalexin] Hives and Other (See Comments)    Also respiratory depression  . Naproxen Hives  . Zithromax [Azithromycin] Hives  . Morphine And Related Itching    Has to take benadryl with it    Family History  Problem Relation Age of Onset  . Diabetes Mother   . Diabetes Father   . Kidney disease Father      Social History   Socioeconomic History  . Marital status: Legally Separated    Spouse name: Not on file  . Number of children: 2  . Years of education: Not on file  . Highest education level: Not on file  Occupational History  . Occupation: Chartered certified accountant  Social Needs  . Financial resource strain: Not on file  . Food insecurity    Worry: Not on file    Inability: Not on file  . Transportation needs    Medical: Not on file    Non-medical: Not on file  Tobacco Use  . Smoking status: Current Every Day Smoker    Packs/day: 0.25    Years: 27.00    Pack years: 6.75    Types: Cigarettes  . Smokeless tobacco: Never Used  Substance and Sexual Activity  . Alcohol use: Yes    Comment: occ  . Drug use: No  . Sexual activity: Yes    Birth control/protection: Surgical  Lifestyle  . Physical activity    Days per week: Not on file    Minutes per  session: Not on file  . Stress: Not on file  Relationships  . Social Herbalist on phone: Not on file    Gets together: Not on file    Attends religious service: Not on file    Active member of club or organization: Not on file    Attends meetings of clubs or organizations: Not on file    Relationship status: Not on file  . Intimate partner violence    Fear of current or ex partner: Not on file    Emotionally abused: Not on file    Physically abused: Not on file    Forced sexual activity: Not on file  Other Topics Concern  . Not on file  Social History Narrative  . Not on file     PHYSICAL EXAM:  VS: LMP 05/21/2016  Physical Exam Gen: NAD, alert, cooperative with exam, well-appearing   Aspiration/Injection Procedure Note ANALIZA FIEN 09-Jul-1970  Procedure: Injection Indications: Left knee pain  Procedure Details Consent: Risks of procedure as well as the alternatives and risks of each were explained to the (patient/caregiver).  Consent for procedure obtained. Time Out:  Verified patient identification, verified procedure, site/side was marked, verified correct patient position, special equipment/implants available, medications/allergies/relevent history reviewed, required imaging and test results available.  Performed.  The area was cleaned with iodine and alcohol swabs.    The left knee superior lateral suprapatellar pouch was injected using 4 cc's of 1% lidocaine with a 22 1 1/2" needle.  The syringe was switched and a 16.8 mg/2 mL of Gelsyn 3 was injected. Ultrasound was used. Images were obtained in  Long views showing the injection.    A sterile dressing was applied.  Patient did tolerate procedure well.     ASSESSMENT & PLAN:   Chondromalacia of left patella Completed 2/3 of Gelsyn injections  - f/u in one week.

## 2019-01-11 ENCOUNTER — Encounter: Payer: Self-pay | Admitting: Family Medicine

## 2019-01-11 ENCOUNTER — Other Ambulatory Visit: Payer: Self-pay

## 2019-01-11 ENCOUNTER — Ambulatory Visit: Payer: Self-pay

## 2019-01-11 ENCOUNTER — Ambulatory Visit (INDEPENDENT_AMBULATORY_CARE_PROVIDER_SITE_OTHER): Payer: 59 | Admitting: Family Medicine

## 2019-01-11 VITALS — BP 113/74 | HR 84 | Ht 66.0 in | Wt 165.0 lb

## 2019-01-11 DIAGNOSIS — M25562 Pain in left knee: Secondary | ICD-10-CM | POA: Diagnosis not present

## 2019-01-11 DIAGNOSIS — M2242 Chondromalacia patellae, left knee: Secondary | ICD-10-CM | POA: Diagnosis not present

## 2019-01-11 NOTE — Patient Instructions (Signed)
Good to see you Please try ice   Please send me a message in MyChart with any questions or updates.  Please see me back in 4 weeks.   --Dr. Schmitz  

## 2019-01-11 NOTE — Assessment & Plan Note (Signed)
Has completed the series of Shelby Moreno - counseled on HEP and supportive care - f/u in 4 weeks.

## 2019-01-11 NOTE — Progress Notes (Signed)
Shelby Moreno - 48 y.o. female MRN [id number]  Date of birth: 02-Jul-1970  SUBJECTIVE:  Including CC & ROS.  Chief Complaint  Patient presents with  . Knee Pain    left knee    Shelby Moreno is a 48 y.o. female that is presenting for her third gel injection..    Review of Systems  HISTORY: Past Medical, Surgical, Social, and Family History Reviewed & Updated per EMR.   Pertinent Historical Findings include:  Past Medical History:  Diagnosis Date  . Anxiety   . Back pain   . Depression   . Migraine   . Vertigo     Past Surgical History:  Procedure Laterality Date  . ABDOMINAL HYSTERECTOMY    . ABLATION    . CESAREAN SECTION    . TUBAL LIGATION      Allergies  Allergen Reactions  . Augmentin [Amoxicillin-Pot Clavulanate] Hives  . Keflex [Cephalexin] Hives and Other (See Comments)    Also respiratory depression  . Naproxen Hives  . Zithromax [Azithromycin] Hives  . Morphine And Related Itching    Has to take benadryl with it    Family History  Problem Relation Age of Onset  . Diabetes Mother   . Diabetes Father   . Kidney disease Father      Social History   Socioeconomic History  . Marital status: Legally Separated    Spouse name: Not on file  . Number of children: 2  . Years of education: Not on file  . Highest education level: Not on file  Occupational History  . Occupation: Chartered certified accountant  Social Needs  . Financial resource strain: Not on file  . Food insecurity    Worry: Not on file    Inability: Not on file  . Transportation needs    Medical: Not on file    Non-medical: Not on file  Tobacco Use  . Smoking status: Current Every Day Smoker    Packs/day: 0.25    Years: 27.00    Pack years: 6.75    Types: Cigarettes  . Smokeless tobacco: Never Used  Substance and Sexual Activity  . Alcohol use: Yes    Comment: occ  . Drug use: No  . Sexual activity: Yes    Birth control/protection: Surgical  Lifestyle  . Physical activity    Days per  week: Not on file    Minutes per session: Not on file  . Stress: Not on file  Relationships  . Social Herbalist on phone: Not on file    Gets together: Not on file    Attends religious service: Not on file    Active member of club or organization: Not on file    Attends meetings of clubs or organizations: Not on file    Relationship status: Not on file  . Intimate partner violence    Fear of current or ex partner: Not on file    Emotionally abused: Not on file    Physically abused: Not on file    Forced sexual activity: Not on file  Other Topics Concern  . Not on file  Social History Narrative  . Not on file     PHYSICAL EXAM:  VS: BP 113/74   Pulse 84   Ht 5\' 6"  (1.676 m)   Wt 165 lb (74.8 kg)   LMP 05/21/2016   BMI 26.63 kg/m  Physical Exam Gen: NAD, alert, cooperative with exam, well-appearing   Aspiration/Injection Procedure Note Shelby Moreno 11/04/1970  Procedure: Injection Indications: Left knee pain  Procedure Details Consent: Risks of procedure as well as the alternatives and risks of each were explained to the (patient/caregiver).  Consent for procedure obtained. Time Out: Verified patient identification, verified procedure, site/side was marked, verified correct patient position, special equipment/implants available, medications/allergies/relevent history reviewed, required imaging and test results available.  Performed.  The area was cleaned with iodine and alcohol swabs.    The left knee superior lateral suprapatellar pouch was injected using 4 cc's of 1% lidocaine with a 22 1 1/2" needle.  The syringe was switched and a 16.8 mg/2 mL of Gelsyn 3 was injected. Ultrasound was used. Images were obtained in  Long views showing the injection.    A sterile dressing was applied.  Patient did tolerate procedure well.    ASSESSMENT & PLAN:   Chondromalacia of left patella Has completed the series of Gelsyn3 - counseled on HEP and supportive  care - f/u in 4 weeks.

## 2019-04-27 NOTE — Telephone Encounter (Signed)
error 

## 2019-04-29 ENCOUNTER — Other Ambulatory Visit: Payer: Self-pay

## 2019-04-29 ENCOUNTER — Encounter: Payer: Self-pay | Admitting: Family Medicine

## 2019-04-29 ENCOUNTER — Ambulatory Visit: Payer: 59 | Admitting: Family Medicine

## 2019-04-29 DIAGNOSIS — G8929 Other chronic pain: Secondary | ICD-10-CM

## 2019-04-29 DIAGNOSIS — M5442 Lumbago with sciatica, left side: Secondary | ICD-10-CM

## 2019-04-29 DIAGNOSIS — M2242 Chondromalacia patellae, left knee: Secondary | ICD-10-CM

## 2019-04-29 NOTE — Assessment & Plan Note (Signed)
Have tried epidurals and those seem to help until recently.  Her pain is acutely worsened. -Provided Duexis samples. -Counseled on home exercise therapy and supportive care. -Could consider epidural if needed.

## 2019-04-29 NOTE — Progress Notes (Signed)
Medication Samples have been provided to the patient.  Drug name: Duexis       Strength: 800mg /26.6mg         Qty: 2 Boxes  LOTHN:[id number]  Exp.Date: 03/2020  Dosing instructions: Take 1 tablet by mouth three (3) times a day.  The patient has been instructed regarding the correct time, dose, and frequency of taking this medication, including desired effects and most common side effects.   Sherrie George, MA 11:16 AM 04/29/2019

## 2019-04-29 NOTE — Assessment & Plan Note (Signed)
Acutely worsening.  No significant effusion on exam today. -Provided Duexis samples. -Counseled on home exercise therapy and supportive care. -Could consider injection if needed.

## 2019-04-29 NOTE — Patient Instructions (Signed)
Good to see you Please try heat on the back  Please try ice on the knee  Please try the exercises  Please try the duexis. Please let me know if you have any problems. Please call back with the fax number.    Please send me a message in MyChart with any questions or updates.  Please see me back in 4 weeks or sooner if needed.   --Dr. Raeford Razor

## 2019-04-29 NOTE — Progress Notes (Signed)
Shelby Moreno - 49 y.o. female MRN [id number]  Date of birth: 1970/06/10  SUBJECTIVE:  Including CC & ROS.  Chief Complaint  Patient presents with  . Follow-up    follow up for left knee and low back    Shelby Moreno is a 49 y.o. female that is presenting with acute worsening of her low back pain and left knee pain.  The pain is been ongoing for a few weeks.  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  The pain is occurring in the lower back with radicular symptoms on the left leg.  Left knee is anterior nature.  She feels like it will give out from time to time.  No new or different activities.  No specific inciting event.   Review of Systems See HPI   HISTORY: Past Medical, Surgical, Social, and Family History Reviewed & Updated per EMR.   Pertinent Historical Findings include:  Past Medical History:  Diagnosis Date  . Anxiety   . Back pain   . Depression   . Migraine   . Vertigo     Past Surgical History:  Procedure Laterality Date  . ABDOMINAL HYSTERECTOMY    . ABLATION    . CESAREAN SECTION    . TUBAL LIGATION      Family History  Problem Relation Age of Onset  . Diabetes Mother   . Diabetes Father   . Kidney disease Father     Social History   Socioeconomic History  . Marital status: Legally Separated    Spouse name: Not on file  . Number of children: 2  . Years of education: Not on file  . Highest education level: Not on file  Occupational History  . Occupation: Chartered certified accountant  Tobacco Use  . Smoking status: Current Every Day Smoker    Packs/day: 0.25    Years: 27.00    Pack years: 6.75    Types: Cigarettes  . Smokeless tobacco: Never Used  Substance and Sexual Activity  . Alcohol use: Yes    Comment: occ  . Drug use: No  . Sexual activity: Yes    Birth control/protection: Surgical  Other Topics Concern  . Not on file  Social History Narrative  . Not on file   Social Determinants of Health   Financial Resource Strain:    . Difficulty of Paying Living Expenses:   Food Insecurity:   . Worried About Charity fundraiser in the Last Year:   . Arboriculturist in the Last Year:   Transportation Needs:   . Film/video editor (Medical):   Marland Kitchen Lack of Transportation (Non-Medical):   Physical Activity:   . Days of Exercise per Week:   . Minutes of Exercise per Session:   Stress:   . Feeling of Stress :   Social Connections:   . Frequency of Communication with Friends and Family:   . Frequency of Social Gatherings with Friends and Family:   . Attends Religious Services:   . Active Member of Clubs or Organizations:   . Attends Archivist Meetings:   Marland Kitchen Marital Status:   Intimate Partner Violence:   . Fear of Current or Ex-Partner:   . Emotionally Abused:   Marland Kitchen Physically Abused:   . Sexually Abused:      PHYSICAL EXAM:  VS: BP 137/90   Pulse 87   Ht 5\' 7"  (1.702 m)   Wt 164 lb (74.4 kg)  LMP 05/21/2016   BMI 25.69 kg/m  Physical Exam Gen: NAD, alert, cooperative with exam, well-appearing MSK:  Back, left hip: No tenderness to palpation of the greater trochanter. Normal strength resistance with hip flexion, plantarflexion and dorsiflexion. No signs of atrophy. Positive straight leg raise. Left knee: No effusion. Some tenderness palpation of the medial joint line. Normal flexion and extension. Normal strength resistance. No instability. Negative McMurray's test. Neurovascularly intact    ASSESSMENT & PLAN:   Chondromalacia of left patella Acutely worsening.  No significant effusion on exam today. -Provided Duexis samples. -Counseled on home exercise therapy and supportive care. -Could consider injection if needed.  Low back pain Have tried epidurals and those seem to help until recently.  Her pain is acutely worsened. -Provided Duexis samples. -Counseled on home exercise therapy and supportive care. -Could consider epidural if needed.

## 2019-05-05 ENCOUNTER — Emergency Department (HOSPITAL_BASED_OUTPATIENT_CLINIC_OR_DEPARTMENT_OTHER)
Admission: EM | Admit: 2019-05-05 | Discharge: 2019-05-05 | Disposition: A | Discharge status: Home / Self Care | Payer: 59 | Attending: Emergency Medicine | Admitting: Emergency Medicine

## 2019-05-05 ENCOUNTER — Encounter (HOSPITAL_BASED_OUTPATIENT_CLINIC_OR_DEPARTMENT_OTHER): Payer: Self-pay

## 2019-05-05 ENCOUNTER — Other Ambulatory Visit: Payer: Self-pay

## 2019-05-05 DIAGNOSIS — H9202 Otalgia, left ear: Secondary | ICD-10-CM

## 2019-05-05 MED ORDER — OFLOXACIN 0.3 % OT SOLN: 5.0000 [drp] | Freq: Two times a day (BID) | OTIC | 0 refills | Status: DC

## 2019-05-05 NOTE — ED Provider Notes (Signed)
[facility] Provider Note   CSN: [id number] Arrival date & time: 05/05/19  2129     History Chief Complaint  Patient presents with  . Otalgia    Shelby Moreno is a 49 y.o. female past medical history of anxiety, chronic back pain, depression, vertigo presents to emergency department today with chief complaint of aggressively worsening left-sided otalgia x1 day.  Patient was cleaning her left ear with a Bobby pin.  She forgot the [facility] pin was in the ear and laid down on her left side causing the bobby pin to go further into her ear.  She was able to pull it out but has constant aching pain and decreased hearing since. Pain radiates to her neck. She rates the pain 7/10 in severity. She take oxycodone for chronic pain, no other medications for her symptoms prior to arrival. She denies fever, chills, facial weakness, vertigo, tinnitus, dizziness, otorrhea, nausea, vomiting.  History provided by patient with additional history obtained from chart review.     Past Medical History:  Diagnosis Date  . Anxiety   . Back pain   . Depression   . Migraine   . Vertigo     Patient Active Problem List   Diagnosis Date Noted  . 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 Trigger finger, acquired 03/04/2016  . Right arm pain 11/24/2015  . 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 Neck pain 01/04/2014  . Low back pain 01/04/2014    Past Surgical History:  Procedure Laterality Date  . ABDOMINAL HYSTERECTOMY    . ABLATION    . CESAREAN SECTION    . TUBAL LIGATION       OB History   No obstetric history on file.     Family History  Problem Relation Age of Onset  . Diabetes Mother   . Diabetes Father   . Kidney disease Father     Social History   Tobacco Use  . Smoking status: Current Every Day Smoker    Packs/day: 0.25    Years: 27.00    Pack years: 6.75    Types: Cigarettes  . Smokeless tobacco: Never Used    Substance Use Topics  . Alcohol use: Not Currently  . Drug use: No    Home Medications Prior to Admission medications   Medication Sig Start Date End Date Taking? Authorizing Provider  gabapentin (NEURONTIN) 100 MG capsule SMARTSIG:1 Capsule(s) By Mouth 5 Times Daily PRN 04/18/19  Yes [provider]  Oxycodone HCl 10 MG TABS Take 10 mg by mouth 3 (three) times daily as needed. 04/20/19  Yes [provider]  cyclobenzaprine (FLEXERIL) 10 MG tablet Take 1 tablet (10 mg total) by mouth 2 (two) times daily as needed. 12/20/18   Rosemarie Ax, MD  Diclofenac Sodium (PENNSAID) 2 % SOLN Place 1 application onto the skin 2 (two) times daily. 09/09/18   Rosemarie Ax, MD  doxycycline (VIBRAMYCIN) 100 MG capsule Take 1 capsule (100 mg total) by mouth 2 (two) times daily. One po bid x 7 days 07/10/17   Palumbo, April, MD  glycopyrrolate (ROBINUL) 1 MG tablet Take 1 tablet (1 mg total) by mouth 2 (two) times daily. 12/30/16   Ladene Artist, MD  HYDROcodone-acetaminophen (NORCO/VICODIN) 5-325 MG tablet Take 1 tablet by mouth 2 (two) times daily as needed for moderate pain. 12/06/18   Rosemarie Ax, MD  hydrOXYzine (ATARAX/VISTARIL) 25 MG tablet Take 25 mg by mouth  2 (two) times daily as needed. 04/19/19   [provider]  meclizine (ANTIVERT) 25 MG tablet Take 1 tablet (25 mg total) by mouth 3 (three) times daily as needed for dizziness. 04/07/17   Horton, Barbette Hair, MD  ofloxacin (FLOXIN) 0.3 % OTIC solution Place 5 drops into the left ear 2 (two) times daily. 05/05/19   Brondon Wann E, PA-C  ondansetron (ZOFRAN) 8 MG tablet Take 8 mg by mouth every 8 (eight) hours as needed. 04/19/19   [provider]  predniSONE (DELTASONE) 5 MG tablet Take 6 pills for first day, 5 pills second day, 4 pills third day, 3 pills fourth day, 2 pills the fifth day, and 1 pill sixth day. 10/07/18   Rosemarie Ax, MD  tiZANidine (ZANAFLEX) 4 MG tablet Take 1 tablet (4 mg total)  by mouth every 8 (eight) hours as needed for muscle spasms. 01/27/17   Dene Gentry, MD  traZODone (DESYREL) 50 MG tablet Take 50 mg by mouth at bedtime. 03/14/19   [provider]  valACYclovir (VALTREX) 500 MG tablet  03/08/19   [provider]    Allergies    Augmentin [amoxicillin-pot clavulanate], Keflex [cephalexin], Naproxen, Zithromax [azithromycin], and Morphine and related  Review of Systems   Review of Systems All other systems are reviewed and are negative for acute change except as noted in the HPI.  Physical Exam Updated Vital Signs BP 133/77 (BP Location: Left Arm)   Pulse 96   Temp 98.3 F (36.8 C) (Oral)   Resp 18   Ht 5\' 6"  (1.676 m)   Wt 76.7 kg   LMP 05/21/2016   SpO2 100%   BMI 27.28 kg/m   Physical Exam Vitals and nursing note reviewed.  Constitutional:      Appearance: She is well-developed. She is not ill-appearing or toxic-appearing.  HENT:     Head: Normocephalic and atraumatic.     Right Ear: Hearing, tympanic membrane, ear canal and external ear normal.     Left Ear: Ear canal and external ear normal. No swelling or tenderness. There is no impacted cerumen. No foreign body. No mastoid tenderness. Tympanic membrane is perforated and erythematous. Tympanic membrane is not bulging. Tympanic membrane has normal mobility.     Nose: Nose normal.  Eyes:     General: No scleral icterus.       Right eye: No discharge.        Left eye: No discharge.     Conjunctiva/sclera: Conjunctivae normal.  Neck:     Vascular: No JVD.  Cardiovascular:     Rate and Rhythm: Normal rate and regular rhythm.     Pulses: Normal pulses.     Heart sounds: Normal heart sounds.  Pulmonary:     Effort: Pulmonary effort is normal.     Breath sounds: Normal breath sounds.  Abdominal:     General: There is no distension.  Musculoskeletal:        General: Normal range of motion.     Cervical back: Normal range of motion.  Skin:    General: Skin is warm  and dry.  Neurological:     Mental Status: She is oriented to person, place, and time.     GCS: GCS eye subscore is 4. GCS verbal subscore is 5. GCS motor subscore is 6.     Comments: Fluent speech, no facial droop.  Psychiatric:        Behavior: Behavior normal.  ED Results / Procedures / Treatments   Labs (all labs ordered are listed, but only abnormal results are displayed) Labs Reviewed - No data to display  EKG None  Radiology No results found.  Procedures Procedures (including critical care time)  Medications Ordered in ED Medications - No data to display  ED Course  I have reviewed the triage vital signs and the nursing notes.  Pertinent labs & imaging results that were available during my care of the patient were reviewed by me and considered in my medical decision making (see chart for details).    MDM Rules/Calculators/A&P                      Patient is well appearing, in no acute distress. VSS. Patient presents with otalgia and exam consistent with tympanic membrane perforation. No concern for acute mastoiditis, meningitis. Will cover with ofloxacin eardrops as perforation occurred with Bobby pin.  Recommend Aleve for pain as needed.  Patient aware she needs to keep left ear dry while healing. Advised parents to call ENT today for follow-up.  I have also discussed reasons to return immediately to the ER.  Patient expresses understanding and agrees with plan.   Portions of this note were generated with Lobbyist. Dictation errors may occur despite best attempts at proofreading.   Final Clinical Impression(s) / ED Diagnoses Final diagnoses:  Otalgia of left ear    Rx / DC Orders ED Discharge Orders         Ordered    ofloxacin (FLOXIN) 0.3 % OTIC solution  2 times daily     05/05/19 2256           Zimri Brennen, Harley Hallmark, PA-C 05/05/19 2308    Lennice Sites, DO 05/05/19 2328

## 2019-05-05 NOTE — ED Triage Notes (Signed)
Pt c/o left earache started this am-NAD-steady gait

## 2019-05-05 NOTE — Discharge Instructions (Signed)
You have been seen today for ear pain. Please read and follow all provided instructions. Return to the emergency room for worsening condition or new concerning symptoms.    1. Medications:  Prescription to your pharmacy for ofloxacin drops.  This is an eardrop.  Please use as prescribed.  Continue usual home medications Take medications as prescribed. Please review all of the medicines and only take them if you do not have an allergy to them.   2. Treatment: rest, drink plenty of fluids  3. Follow Up:  Please follow up with [facility] ear nose and throat by scheduling an appointment as soon as possible for a visit     It is also a possibility that you have an allergic reaction to any of the medicines that you have been prescribed - Everybody reacts differently to medications and while MOST people have no trouble with most medicines, you may have a reaction such as nausea, vomiting, rash, swelling, shortness of breath. If this is the case, please stop taking the medicine immediately and contact your physician.  ?

## 2019-05-24 ENCOUNTER — Ambulatory Visit: Payer: 59 | Admitting: Family Medicine

## 2019-05-24 NOTE — Progress Notes (Deleted)
  Shelby Moreno - 49 y.o. female MRN [id number]  Date of birth: [birth year]  SUBJECTIVE:  Including CC & ROS.  No chief complaint on file.   Shelby Moreno is a 49 y.o. female that is  ***.  ***   Review of Systems See HPI   HISTORY: Past Medical, Surgical, Social, and Family History Reviewed & Updated per EMR.   Pertinent Historical Findings include:  Past Medical History:  Diagnosis Date  . Anxiety   . Back pain   . Depression   . Migraine   . Vertigo     Past Surgical History:  Procedure Laterality Date  . ABDOMINAL HYSTERECTOMY    . ABLATION    . CESAREAN SECTION    . TUBAL LIGATION      Family History  Problem Relation Age of Onset  . Diabetes Mother   . Diabetes Father   . Kidney disease Father     Social History   Socioeconomic History  . Marital status: Legally Separated    Spouse name: Not on file  . Number of children: 2  . Years of education: Not on file  . Highest education level: Not on file  Occupational History  . Occupation: Chartered certified accountant  Tobacco Use  . Smoking status: Current Every Day Smoker    Packs/day: 0.25    Years: 27.00    Pack years: 6.75    Types: Cigarettes  . Smokeless tobacco: Never Used  Substance and Sexual Activity  . Alcohol use: Not Currently  . Drug use: No  . Sexual activity: Not on file  Other Topics Concern  . Not on file  Social History Narrative  . Not on file   Social Determinants of Health   Financial Resource Strain:   . Difficulty of Paying Living Expenses:   Food Insecurity:   . Worried About Charity fundraiser in the Last Year:   . Arboriculturist in the Last Year:   Transportation Needs:   . Film/video editor (Medical):   Marland Kitchen Lack of Transportation (Non-Medical):   Physical Activity:   . Days of Exercise per Week:   . Minutes of Exercise per Session:   Stress:   . Feeling of Stress :   Social Connections:   . Frequency of Communication with Friends and Family:   . Frequency of Social  Gatherings with Friends and Family:   . Attends Religious Services:   . Active Member of Clubs or Organizations:   . Attends Archivist Meetings:   Marland Kitchen Marital Status:   Intimate Partner Violence:   . Fear of Current or Ex-Partner:   . Emotionally Abused:   Marland Kitchen Physically Abused:   . Sexually Abused:      PHYSICAL EXAM:  VS: LMP 05/21/2016  Physical Exam Gen: NAD, alert, cooperative with exam, well-appearing MSK:  ***      ASSESSMENT & PLAN:   No problem-specific Assessment & Plan notes found for this encounter.

## 2019-06-05 ENCOUNTER — Other Ambulatory Visit: Payer: Self-pay

## 2019-06-05 ENCOUNTER — Encounter (HOSPITAL_BASED_OUTPATIENT_CLINIC_OR_DEPARTMENT_OTHER): Payer: Self-pay | Admitting: *Deleted

## 2019-06-05 DIAGNOSIS — Z5321 Procedure and treatment not carried out due to patient leaving prior to being seen by health care provider: Secondary | ICD-10-CM | POA: Diagnosis not present

## 2019-06-05 DIAGNOSIS — R519 Headache, unspecified: Secondary | ICD-10-CM | POA: Diagnosis present

## 2019-06-05 NOTE — ED Triage Notes (Signed)
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 Tonight a resident was attempting to climb out the window. When pt closed the window the resident pulled it open (tilt in) and it hit her on her left side

## 2019-06-06 ENCOUNTER — Ambulatory Visit (HOSPITAL_BASED_OUTPATIENT_CLINIC_OR_DEPARTMENT_OTHER)
Admission: RE | Admit: 2019-06-06 | Discharge: 2019-06-06 | Disposition: A | Discharge status: Home / Self Care | Payer: No Typology Code available for payment source | Source: Ambulatory Visit | Attending: Family Medicine | Admitting: Family Medicine

## 2019-06-06 ENCOUNTER — Emergency Department (HOSPITAL_BASED_OUTPATIENT_CLINIC_OR_DEPARTMENT_OTHER)
Admission: EM | Admit: 2019-06-06 | Discharge: 2019-06-06 | Disposition: A | Discharge status: Home / Self Care | Payer: No Typology Code available for payment source | Attending: Emergency Medicine | Admitting: Emergency Medicine

## 2019-06-06 ENCOUNTER — Ambulatory Visit (INDEPENDENT_AMBULATORY_CARE_PROVIDER_SITE_OTHER): Payer: Self-pay | Admitting: Family Medicine

## 2019-06-06 ENCOUNTER — Encounter: Payer: Self-pay | Admitting: Family Medicine

## 2019-06-06 VITALS — BP 162/101 | HR 91 | Ht 67.0 in | Wt 164.0 lb

## 2019-06-06 DIAGNOSIS — R0781 Pleurodynia: Secondary | ICD-10-CM

## 2019-06-06 DIAGNOSIS — M5442 Lumbago with sciatica, left side: Secondary | ICD-10-CM

## 2019-06-06 DIAGNOSIS — G8929 Other chronic pain: Secondary | ICD-10-CM

## 2019-06-06 MED ORDER — LIDOCAINE 5 % EX PTCH: 1.0000 | MEDICATED_PATCH | Freq: Two times a day (BID) | CUTANEOUS | 2 refills | Status: DC

## 2019-06-06 MED ORDER — IBUPROFEN-FAMOTIDINE 800-26.6 MG PO TABS: 1.0000 | ORAL_TABLET | Freq: Three times a day (TID) | ORAL | 3 refills | Status: DC

## 2019-06-06 NOTE — Patient Instructions (Signed)
Good to see you Please try to alternate heat and ice  Physical therapy will give you a call.  I will call with the results from today   Please send me a message in MyChart with any questions or updates.  Please see me back in 4 weeks.   --Dr. Raeford Razor

## 2019-06-06 NOTE — Progress Notes (Signed)
Shelby Moreno - 49 y.o. female MRN [id number]  Date of birth: 22-Jul-1970  SUBJECTIVE:  Including CC & ROS.  Chief Complaint  Patient presents with  . Leg Injury    left leg  . Back Injury    left-sided low back    Shelby Moreno is a 49 y.o. female that is presenting with left flank rib pain and worsening low back and left leg pain.  This occurred after she was pinned against a window I have patient last night at work.  Most of this pain was exacerbated.  911 had to be called in order for the patient to be removed.  She has had pain in these areas since that time.  She does have a history of low back pain with radicular symptoms and has received injections previously.   Review of Systems See HPI   HISTORY: Past Medical, Surgical, Social, and Family History Reviewed & Updated per EMR.   Pertinent Historical Findings include:  Past Medical History:  Diagnosis Date  . Anxiety   . Back pain   . Depression   . Migraine   . Vertigo     Past Surgical History:  Procedure Laterality Date  . ABDOMINAL HYSTERECTOMY    . ABLATION    . CESAREAN SECTION    . TUBAL LIGATION      Family History  Problem Relation Age of Onset  . Diabetes Mother   . Diabetes Father   . Kidney disease Father     Social History   Socioeconomic History  . Marital status: Legally Separated    Spouse name: Not on file  . Number of children: 2  . Years of education: Not on file  . Highest education level: Not on file  Occupational History  . Occupation: Chartered certified accountant  Tobacco Use  . Smoking status: Current Every Day Smoker    Packs/day: 0.25    Years: 27.00    Pack years: 6.75    Types: Cigarettes  . Smokeless tobacco: Never Used  Substance and Sexual Activity  . Alcohol use: Not Currently  . Drug use: No  . Sexual activity: Not on file  Other Topics Concern  . Not on file  Social History Narrative  . Not on file   Social Determinants of Health   Financial Resource Strain:   .  Difficulty of Paying Living Expenses:   Food Insecurity:   . Worried About Charity fundraiser in the Last Year:   . Arboriculturist in the Last Year:   Transportation Needs:   . Film/video editor (Medical):   Marland Kitchen Lack of Transportation (Non-Medical):   Physical Activity:   . Days of Exercise per Week:   . Minutes of Exercise per Session:   Stress:   . Feeling of Stress :   Social Connections:   . Frequency of Communication with Friends and Family:   . Frequency of Social Gatherings with Friends and Family:   . Attends Religious Services:   . Active Member of Clubs or Organizations:   . Attends Archivist Meetings:   Marland Kitchen Marital Status:   Intimate Partner Violence:   . Fear of Current or Ex-Partner:   . Emotionally Abused:   Marland Kitchen Physically Abused:   . Sexually Abused:      PHYSICAL EXAM:  VS: BP (!) 162/101   Pulse 91   Ht 5\' 7"  (1.702 m)   Wt 164 lb (74.4 kg)   LMP 05/21/2016  BMI 25.69 kg/m  Physical Exam Gen: NAD, alert, cooperative with exam, well-appearing MSK:  Left flank: Tenderness to palpation over the seventh through ninth flank rib. No step-offs or crepitus.  Shaded. Normal shoulder range of motion. Back: Normal flexion extension. Normal internal and external rotation of the hip. Normal strength resistance with hip flexion. Negative straight leg raise. Neurovascularly intact     ASSESSMENT & PLAN:   Rib pain on left side Injury that occurred at work.  Possible for contusion.  Seems less likely for fracture. -X-ray. -Counseled on home exercise therapy and supportive care. -Lidoderm patches. -Referral to physical therapy.  Low back pain Acutely exacerbated from injury at work last night. -Duexis. -Counseled on home exercise therapy and supportive care. -Referral to physical therapy. -Provided work note.

## 2019-06-06 NOTE — Assessment & Plan Note (Signed)
Injury that occurred at work.  Possible for contusion.  Seems less likely for fracture. -X-ray. -Counseled on home exercise therapy and supportive care. -Lidoderm patches. -Referral to physical therapy.

## 2019-06-06 NOTE — ED Notes (Signed)
Pt did not answer when called times 3 

## 2019-06-06 NOTE — Assessment & Plan Note (Addendum)
Acutely exacerbated from injury at work last night. -Duexis. -Counseled on home exercise therapy and supportive care. -Referral to physical therapy. -Provided work note.

## 2019-06-07 ENCOUNTER — Telehealth: Payer: Self-pay | Admitting: Family Medicine

## 2019-06-07 NOTE — Telephone Encounter (Signed)
Informed of xray results.   Rosemarie Ax, MD [facility] 06/07/2019, 8:25 AM

## 2019-07-04 ENCOUNTER — Encounter: Payer: 59 | Admitting: Family Medicine

## 2019-07-04 NOTE — Progress Notes (Deleted)
  Shelby Moreno - 49 y.o. female MRN [id number]  Date of birth: 04/09/1970  SUBJECTIVE:  Including CC & ROS.  No chief complaint on file.   Shelby Moreno is a 49 y.o. female that is  ***.  ***   Review of Systems See HPI   HISTORY: Past Medical, Surgical, Social, and Family History Reviewed & Updated per EMR.   Pertinent Historical Findings include:  Past Medical History:  Diagnosis Date  . Anxiety   . Back pain   . Depression   . Migraine   . Vertigo     Past Surgical History:  Procedure Laterality Date  . ABDOMINAL HYSTERECTOMY    . ABLATION    . CESAREAN SECTION    . TUBAL LIGATION      Family History  Problem Relation Age of Onset  . Diabetes Mother   . Diabetes Father   . Kidney disease Father     Social History   Socioeconomic History  . Marital status: Legally Separated    Spouse name: Not on file  . Number of children: 2  . Years of education: Not on file  . Highest education level: Not on file  Occupational History  . Occupation: Chartered certified accountant  Tobacco Use  . Smoking status: Current Every Day Smoker    Packs/day: 0.25    Years: 27.00    Pack years: 6.75    Types: Cigarettes  . Smokeless tobacco: Never Used  Substance and Sexual Activity  . Alcohol use: Not Currently  . Drug use: No  . Sexual activity: Not on file  Other Topics Concern  . Not on file  Social History Narrative  . Not on file   Social Determinants of Health   Financial Resource Strain:   . Difficulty of Paying Living Expenses:   Food Insecurity:   . Worried About Charity fundraiser in the Last Year:   . Arboriculturist in the Last Year:   Transportation Needs:   . Film/video editor (Medical):   Marland Kitchen Lack of Transportation (Non-Medical):   Physical Activity:   . Days of Exercise per Week:   . Minutes of Exercise per Session:   Stress:   . Feeling of Stress :   Social Connections:   . Frequency of Communication with Friends and Family:   . Frequency of Social  Gatherings with Friends and Family:   . Attends Religious Services:   . Active Member of Clubs or Organizations:   . Attends Archivist Meetings:   Marland Kitchen Marital Status:   Intimate Partner Violence:   . Fear of Current or Ex-Partner:   . Emotionally Abused:   Marland Kitchen Physically Abused:   . Sexually Abused:      PHYSICAL EXAM:  VS: LMP 05/21/2016  Physical Exam Gen: NAD, alert, cooperative with exam, well-appearing MSK:  ***      ASSESSMENT & PLAN:   No problem-specific Assessment & Plan notes found for this encounter.

## 2019-11-14 ENCOUNTER — Encounter: Payer: Self-pay | Admitting: Family Medicine

## 2019-11-14 ENCOUNTER — Other Ambulatory Visit: Payer: Self-pay

## 2019-11-14 ENCOUNTER — Ambulatory Visit: Payer: Self-pay

## 2019-11-14 ENCOUNTER — Ambulatory Visit (INDEPENDENT_AMBULATORY_CARE_PROVIDER_SITE_OTHER): Payer: 59 | Admitting: Family Medicine

## 2019-11-14 VITALS — BP 139/88 | HR 81 | Ht 66.0 in | Wt 165.0 lb

## 2019-11-14 DIAGNOSIS — M659 Synovitis and tenosynovitis, unspecified: Secondary | ICD-10-CM | POA: Diagnosis not present

## 2019-11-14 DIAGNOSIS — M79644 Pain in right finger(s): Secondary | ICD-10-CM

## 2019-11-14 MED ORDER — TRIAMCINOLONE ACETONIDE 40 MG/ML IJ SUSP
40.0000 mg | Freq: Once | INTRAMUSCULAR | Status: AC
  Administered 2019-11-14: 40 mg via INTRA_ARTICULAR

## 2019-11-14 NOTE — Progress Notes (Addendum)
Shelby Moreno - 49 y.o. female MRN [id number]  Date of birth: 1970/10/18  SUBJECTIVE:  Including CC & ROS.  No chief complaint on file.   Shelby Moreno is a 49 y.o. female that is presenting with right thumb pain.  She has a history of similar pain in the past.  Does get improvement with injection.  She has swelling over the whole digit.  Does not have specific triggering..   Review of Systems See HPI   HISTORY: Past Medical, Surgical, Social, and Family History Reviewed & Updated per EMR.   Pertinent Historical Findings include:  Past Medical History:  Diagnosis Date  . Anxiety   . Back pain   . Depression   . Migraine   . Vertigo     Past Surgical History:  Procedure Laterality Date  . ABDOMINAL HYSTERECTOMY    . ABLATION    . CESAREAN SECTION    . TUBAL LIGATION      Family History  Problem Relation Age of Onset  . Diabetes Mother   . Diabetes Father   . Kidney disease Father     Social History   Socioeconomic History  . Marital status: Legally Separated    Spouse name: Not on file  . Number of children: 2  . Years of education: Not on file  . Highest education level: Not on file  Occupational History  . Occupation: Chartered certified accountant  Tobacco Use  . Smoking status: Current Every Day Smoker    Packs/day: 0.25    Years: 27.00    Pack years: 6.75    Types: Cigarettes  . Smokeless tobacco: Never Used  Vaping Use  . Vaping Use: Never used  Substance and Sexual Activity  . Alcohol use: Not Currently  . Drug use: No  . Sexual activity: Not on file  Other Topics Concern  . Not on file  Social History Narrative  . Not on file   Social Determinants of Health   Financial Resource Strain:   . Difficulty of Paying Living Expenses: Not on file  Food Insecurity:   . Worried About Charity fundraiser in the Last Year: Not on file  . Ran Out of Food in the Last Year: Not on file  Transportation Needs:   . Lack of Transportation (Medical): Not on file  . Lack  of Transportation (Non-Medical): Not on file  Physical Activity:   . Days of Exercise per Week: Not on file  . Minutes of Exercise per Session: Not on file  Stress:   . Feeling of Stress : Not on file  Social Connections:   . Frequency of Communication with Friends and Family: Not on file  . Frequency of Social Gatherings with Friends and Family: Not on file  . Attends Religious Services: Not on file  . Active Member of Clubs or Organizations: Not on file  . Attends Archivist Meetings: Not on file  . Marital Status: Not on file  Intimate Partner Violence:   . Fear of Current or Ex-Partner: Not on file  . Emotionally Abused: Not on file  . Physically Abused: Not on file  . Sexually Abused: Not on file     PHYSICAL EXAM:  VS: LMP 05/21/2016  Physical Exam Gen: NAD, alert, cooperative with exam, well-appearing MSK:  Right thumb: Obvious swelling of the thumb itself. No redness or streaking. Limited flexion extension. No obvious triggering. Neurovascular intact  Limited ultrasound: Right thumb :  No obvious changes at the  first dorsal compartment. No changes at the [facility] joint. Some hyperemia associated with the interphalangeal joint to suggest a synovitis. Increased hyperemia and effusion through the flexor tendon to suggest a tenosynovitis.  Summary: Findings suggest flexor tenosynovitis of the thumb.  Ultrasound and interpretation by Clearance Coots, MD   Aspiration/Injection Procedure Note Shelby Moreno January 06, 1971  Procedure: Injection Indications: right thumb pain   Procedure Details Consent: Risks of procedure as well as the alternatives and risks of each were explained to the (patient/caregiver).  Consent for procedure obtained. Time Out: Verified patient identification, verified procedure, site/side was marked, verified correct patient position, special equipment/implants available, medications/allergies/relevent history reviewed, required imaging and  test results available.  Performed.  The area was cleaned with iodine and alcohol swabs.    The right flexor tendon of the thumb was injected using 1 cc's of 40 mg Kenalog and 1 cc's of 0.25% bupivacaine with a 25 1 1/2" needle.  Ultrasound was used. Images were obtained in long views showing the injection.     A sterile dressing was applied.  Patient did tolerate procedure well.    ASSESSMENT & PLAN:   Flexor tenosynovitis of thumb Acute worsening. Has gotten improvement with injections in the past. No improvement with recent prednisone use. Concern for possible inflammatory origin as opposed to infection. Seems less like for trigger finger  -Counseled on supportive care. -Injection. -Sed rate, CRP, uric acid, ANA panel.

## 2019-11-14 NOTE — Addendum Note (Signed)
Addended by: Sherrie George F on: 11/14/2019 03:55 PM   Modules accepted: Orders

## 2019-11-14 NOTE — Assessment & Plan Note (Signed)
Acute worsening. Has gotten improvement with injections in the past. No improvement with recent prednisone use. Concern for possible inflammatory origin as opposed to infection. Seems less like for trigger finger  -Counseled on supportive care. -Injection. -Sed rate, CRP, uric acid, ANA panel.

## 2019-11-14 NOTE — Patient Instructions (Signed)
Good to see you Please try ice  I will call when I get the results.   Please send me a message in MyChart with any questions or updates.  Please see me back in 4-6 weeks.   --Dr. Raeford Razor

## 2019-11-17 ENCOUNTER — Telehealth: Payer: Self-pay | Admitting: Family Medicine

## 2019-11-17 LAB — ANA,IFA RA DIAG PNL W/RFLX TIT/PATN
ANA Titer 1: NEGATIVE
Cyclic Citrullin Peptide Ab: 10 units (ref 0–19)
Rheumatoid fact SerPl-aCnc: 10.1 IU/mL (ref 0.0–13.9)

## 2019-11-17 LAB — SEDIMENTATION RATE: Sed Rate: 3 mm/hr (ref 0–32)

## 2019-11-17 LAB — C-REACTIVE PROTEIN: CRP: 6 mg/L (ref 0–10)

## 2019-11-17 LAB — URIC ACID: Uric Acid: 5.3 mg/dL (ref 2.6–6.2)

## 2019-11-17 NOTE — Telephone Encounter (Signed)
Informed of results.   Rosemarie Ax, MD [facility] 11/17/2019, 10:30 AM

## 2019-11-29 ENCOUNTER — Ambulatory Visit (INDEPENDENT_AMBULATORY_CARE_PROVIDER_SITE_OTHER): Payer: 59 | Admitting: Family Medicine

## 2019-11-29 ENCOUNTER — Other Ambulatory Visit: Payer: Self-pay

## 2019-11-29 ENCOUNTER — Encounter: Payer: Self-pay | Admitting: Family Medicine

## 2019-11-29 DIAGNOSIS — M65311 Trigger thumb, right thumb: Secondary | ICD-10-CM

## 2019-11-29 NOTE — Progress Notes (Signed)
Shelby Moreno - 49 y.o. female MRN [id number]  Date of birth: [birth year]  SUBJECTIVE:  Including CC & ROS.  Chief Complaint  Patient presents with  . Follow-up    right thumb    Shelby Moreno is a 49 y.o. female that is following up for her right thumb pain.  Her swelling and redness have improved but she still has ongoing triggering.   Review of Systems See HPI   HISTORY: Past Medical, Surgical, Social, and Family History Reviewed & Updated per EMR.   Pertinent Historical Findings include:  Past Medical History:  Diagnosis Date  . Anxiety   . Back pain   . Depression   . Migraine   . Vertigo     Past Surgical History:  Procedure Laterality Date  . ABDOMINAL HYSTERECTOMY    . ABLATION    . CESAREAN SECTION    . TUBAL LIGATION      Family History  Problem Relation Age of Onset  . Diabetes Mother   . Diabetes Father   . Kidney disease Father     Social History   Socioeconomic History  . Marital status: Legally Separated    Spouse name: Not on file  . Number of children: 2  . Years of education: Not on file  . Highest education level: Not on file  Occupational History  . Occupation: Chartered certified accountant  Tobacco Use  . Smoking status: Current Every Day Smoker    Packs/day: 0.25    Years: 27.00    Pack years: 6.75    Types: Cigarettes  . Smokeless tobacco: Never Used  Vaping Use  . Vaping Use: Never used  Substance and Sexual Activity  . Alcohol use: Not Currently  . Drug use: No  . Sexual activity: Not on file  Other Topics Concern  . Not on file  Social History Narrative  . Not on file   Social Determinants of Health   Financial Resource Strain:   . Difficulty of Paying Living Expenses: Not on file  Food Insecurity:   . Worried About Charity fundraiser in the Last Year: Not on file  . Ran Out of Food in the Last Year: Not on file  Transportation Needs:   . Lack of Transportation (Medical): Not on file  . Lack of Transportation (Non-Medical): Not  on file  Physical Activity:   . Days of Exercise per Week: Not on file  . Minutes of Exercise per Session: Not on file  Stress:   . Feeling of Stress : Not on file  Social Connections:   . Frequency of Communication with Friends and Family: Not on file  . Frequency of Social Gatherings with Friends and Family: Not on file  . Attends Religious Services: Not on file  . Active Member of Clubs or Organizations: Not on file  . Attends Archivist Meetings: Not on file  . Marital Status: Not on file  Intimate Partner Violence:   . Fear of Current or Ex-Partner: Not on file  . Emotionally Abused: Not on file  . Physically Abused: Not on file  . Sexually Abused: Not on file     PHYSICAL EXAM:  VS: BP (!) 140/98   Pulse 90   Ht 5\' 6"  (1.676 m)   Wt 162 lb (73.5 kg)   LMP 05/21/2016   BMI 26.15 kg/m  Physical Exam Gen: NAD, alert, cooperative with exam, well-appearing MSK:  Right thumb: Triggering evident. No swelling or ecchymosis or redness.  Normal range of motion. Neurovascularly intact     ASSESSMENT & PLAN:   Trigger thumb of right hand Inflammation has improved but still triggering. Recurrent problem  -Counseled on supportive care and range of motion. -Provided splint. -Referral to surgery.

## 2019-11-29 NOTE — Patient Instructions (Signed)
Good to see you Please try the splint at night  The surgeons office should give you a call   Please send me a message in [place] with any questions or updates.  Please see [place] back as needed.   --Dr. Raeford Razor

## 2019-11-29 NOTE — Assessment & Plan Note (Addendum)
Inflammation has improved but still triggering. Recurrent problem  -Counseled on supportive care and range of motion. -Provided splint. -Referral to surgery.

## 2020-04-15 ENCOUNTER — Emergency Department (HOSPITAL_BASED_OUTPATIENT_CLINIC_OR_DEPARTMENT_OTHER)
Admission: EM | Admit: 2020-04-15 | Discharge: 2020-04-15 | Disposition: A | Discharge status: Home / Self Care | Payer: No Typology Code available for payment source | Attending: Emergency Medicine | Admitting: Emergency Medicine

## 2020-04-15 ENCOUNTER — Encounter (HOSPITAL_BASED_OUTPATIENT_CLINIC_OR_DEPARTMENT_OTHER): Payer: Self-pay | Admitting: Emergency Medicine

## 2020-04-15 ENCOUNTER — Other Ambulatory Visit: Payer: Self-pay

## 2020-04-15 DIAGNOSIS — X500XXA Overexertion from strenuous movement or load, initial encounter: Secondary | ICD-10-CM | POA: Insufficient documentation

## 2020-04-15 DIAGNOSIS — M545 Low back pain, unspecified: Secondary | ICD-10-CM | POA: Diagnosis present

## 2020-04-15 DIAGNOSIS — Z8739 Personal history of other diseases of the musculoskeletal system and connective tissue: Secondary | ICD-10-CM | POA: Insufficient documentation

## 2020-04-15 DIAGNOSIS — M5442 Lumbago with sciatica, left side: Secondary | ICD-10-CM | POA: Insufficient documentation

## 2020-04-15 DIAGNOSIS — F17210 Nicotine dependence, cigarettes, uncomplicated: Secondary | ICD-10-CM | POA: Diagnosis not present

## 2020-04-15 DIAGNOSIS — Y990 Civilian activity done for income or pay: Secondary | ICD-10-CM | POA: Insufficient documentation

## 2020-04-15 MED ORDER — LIDOCAINE 5 % EX PTCH
1.0000 | MEDICATED_PATCH | CUTANEOUS | Status: DC
  Administered 2020-04-15: 1 via TRANSDERMAL
  Filled 2020-04-15: qty 1

## 2020-04-15 MED ORDER — FENTANYL CITRATE (PF) 100 MCG/2ML IJ SOLN
50.0000 ug | Freq: Once | INTRAMUSCULAR | Status: AC
  Administered 2020-04-15: 50 ug via INTRAMUSCULAR
  Filled 2020-04-15: qty 2

## 2020-04-15 MED ORDER — FENTANYL CITRATE (PF) 100 MCG/2ML IJ SOLN: 50.0000 ug | Freq: Once | INTRAMUSCULAR | Status: DC

## 2020-04-15 NOTE — ED Triage Notes (Signed)
Reports she injured her lower back working with a patient at work.  Describes as a aching/muscle spasm type pain.  Has not taken anything for pain.

## 2020-04-15 NOTE — ED Provider Notes (Signed)
Shelby Moreno Provider Note   CSN: [id number] Arrival date & time: 04/15/20  2120     History Chief Complaint  Patient presents with  . Back Pain    Shelby Moreno is a 50 y.o. female presenting for evaluation of acute on chronic back pain.  Patient states around 7:00 tonight she helped lift a patient and twisted, felt something pop in her back.  Since then, she has had worsening low back pain.  It is bilateral, worse in the left side.  Radiates down her left leg.  She has a history of chronic back pain, follows with the pain clinic and orthopedics.  She is currently on oxycodone and 2 forms of muscle relaxers.  She has not had any of this today.  She denies fevers, chills, abdominal pain, urinary symptoms, normal bowel movements.  She reports pain is constant, worse with movement and palpation, nothing makes it better. Additionally, patient states she recently had surgery on her left thumb with Dr. Apolonio Schneiders 4 days ago.  She is concerned that she popped a stitch while helping lift the patient.   HPI     Past Medical History:  Diagnosis Date  . Anxiety   . Back pain   . Depression   . Migraine   . Vertigo     Patient Active Problem List   Diagnosis Date Noted  . Rib pain on left side 06/06/2019  . 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 Trigger thumb of right hand 03/04/2016  . Right arm pain 11/24/2015  . 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 Neck pain 01/04/2014  . Low back pain 01/04/2014    Past Surgical History:  Procedure Laterality Date  . ABDOMINAL HYSTERECTOMY    . ABLATION    . CESAREAN SECTION    . TUBAL LIGATION       OB History   No obstetric history on file.     Family History  Problem Relation Age of Onset  . Diabetes Mother   . Diabetes Father   . Kidney disease Father     Social History   Tobacco Use  . Smoking status: Current Every Day Smoker    Packs/day: 0.25    Years:  27.00    Pack years: 6.75    Types: Cigarettes  . Smokeless tobacco: Never Used  Vaping Use  . Vaping Use: Never used  Substance Use Topics  . Alcohol use: Not Currently  . Drug use: No    Home Medications Prior to Admission medications   Medication Sig Start Date End Date Taking? Authorizing Provider  cyclobenzaprine (FLEXERIL) 10 MG tablet Take 1 tablet (10 mg total) by mouth 2 (two) times daily as needed. 12/20/18   Rosemarie Ax, MD  Diclofenac Sodium (PENNSAID) 2 % SOLN Place 1 application onto the skin 2 (two) times daily. 09/09/18   Rosemarie Ax, MD  doxycycline (VIBRAMYCIN) 100 MG capsule Take 1 capsule (100 mg total) by mouth 2 (two) times daily. One po bid x 7 days 07/10/17   Palumbo, April, MD  gabapentin (NEURONTIN) 100 MG capsule SMARTSIG:1 Capsule(s) By Mouth 5 Times Daily PRN 04/18/19   [provider]  glycopyrrolate (ROBINUL) 1 MG tablet Take 1 tablet (1 mg total) by mouth 2 (two) times daily. 12/30/16   Ladene Artist, MD  HYDROcodone-acetaminophen (NORCO/VICODIN) 5-325 MG tablet Take 1 tablet by mouth 2 (two) times daily as needed for moderate pain. 12/06/18  Rosemarie Ax, MD  hydrOXYzine (ATARAX/VISTARIL) 25 MG tablet Take 25 mg by mouth 2 (two) times daily as needed. 04/19/19   [provider]  Ibuprofen-Famotidine 800-26.6 MG TABS Take 1 tablet by mouth 3 (three) times daily. 06/06/19   Rosemarie Ax, MD  lidocaine (LIDODERM) 5 % Place 1 patch onto the skin every 12 (twelve) hours. Remove & Discard patch within 12 hours or as directed by MD 06/06/19   Rosemarie Ax, MD  meclizine (ANTIVERT) 25 MG tablet Take 1 tablet (25 mg total) by mouth 3 (three) times daily as needed for dizziness. 04/07/17   Horton, Barbette Hair, MD  ofloxacin (FLOXIN) 0.3 % OTIC solution Place 5 drops into the left ear 2 (two) times daily. 05/05/19   Walisiewicz, Kaitlyn E, PA-C  ondansetron (ZOFRAN) 8 MG tablet Take 8 mg by mouth every 8 (eight) hours as needed.  04/19/19   [provider]  Oxycodone HCl 10 MG TABS Take 10 mg by mouth 3 (three) times daily as needed. 04/20/19   [provider]  predniSONE (DELTASONE) 5 MG tablet Take 6 pills for first day, 5 pills second day, 4 pills third day, 3 pills fourth day, 2 pills the fifth day, and 1 pill sixth day. 10/07/18   Rosemarie Ax, MD  tiZANidine (ZANAFLEX) 4 MG tablet Take 1 tablet (4 mg total) by mouth every 8 (eight) hours as needed for muscle spasms. 01/27/17   Dene Gentry, MD  traZODone (DESYREL) 50 MG tablet Take 50 mg by mouth at bedtime. 03/14/19   [provider]  valACYclovir (VALTREX) 500 MG tablet  03/08/19   [provider]    Allergies    Augmentin [amoxicillin-pot clavulanate], Keflex [cephalexin], Naproxen, Zithromax [azithromycin], and Morphine and related  Review of Systems   Review of Systems  Musculoskeletal: Positive for back pain.  Skin: Positive for wound (felt stitch pop).    Physical Exam Updated Vital Signs BP 124/89 (BP Location: Left Arm)   Pulse 97   Temp 98.4 F (36.9 C) (Oral)   Resp 18   Ht 5\' 7"  (1.702 m)   Wt 73.5 kg   LMP 05/21/2016   SpO2 100%   BMI 25.37 kg/m   Physical Exam Vitals and nursing note reviewed.  Constitutional:      General: She is not in acute distress.    Appearance: She is well-developed and well-nourished.     Comments: Resting in the bed in no acute distress  HENT:     Head: Normocephalic and atraumatic.  Eyes:     Extraocular Movements: EOM normal.  Cardiovascular:     Rate and Rhythm: Normal rate and regular rhythm.     Pulses: Normal pulses.  Pulmonary:     Effort: Pulmonary effort is normal.  Abdominal:     General: There is no distension.     Palpations: Abdomen is soft. There is no mass.     Tenderness: There is no abdominal tenderness. There is no guarding or rebound.  Musculoskeletal:        General: Tenderness present. Normal range of motion.     Cervical back: Normal  range of motion.     Comments: Diffuse tenderness palpation of the entire back.  No focal tenderness Positive straight leg raise on the left.  Patient reports slightly decreased sensation of the left leg.  No saddle anesthesia   Skin:    General: Skin is warm.     Capillary Refill: Capillary  refill takes less than 2 seconds.     Findings: No rash.     Comments: Stitches at the base of the L thumb without signs of rupture or wound dehiscence. No bleeding   Neurological:     Mental Status: She is alert and oriented to person, place, and time.  Psychiatric:        Mood and Affect: Mood and affect normal.     ED Results / Procedures / Treatments   Labs (all labs ordered are listed, but only abnormal results are displayed) Labs Reviewed - No data to display  EKG None  Radiology No results found.  Procedures Procedures   Medications Ordered in ED Medications  fentaNYL (SUBLIMAZE) injection 50 mcg (has no administration in time range)  lidocaine (LIDODERM) 5 % 1 patch (has no administration in time range)    ED Course  I have reviewed the triage vital signs and the nursing notes.  Pertinent labs & imaging results that were available during my care of the patient were reviewed by me and considered in my medical decision making (see chart for details).    MDM Rules/Calculators/A&P                          Patient presented for evaluation of back pain and concerned that her stitches popped from her left thumb.  On exam, patient appears nontoxic.  Sutures in the left hand are intact without signs of wound dehiscence or rupture.  No bleeding.  She does have positive straight leg raise on the left with slightly decrease in sensation on that side.  Likely sciatica.  She does have chronic pain, this is probably contributing to her symptoms.  I discussed a variety of options including prednisone, which patient states does not work for her.  Discussed Toradol, patient declined.  Patient  has not had any of her oxycodone or muscle relaxers today, this will likely help her symptoms.  Will give a single dose of pain medication in the ED and a Lidoderm patch, and plan for patient to take home medications.  Follow-up with pain clinic and/or orthopedics as needed.  At this time, patient appears safe for discharge.  Return precautions given.  Patient states she understands and agrees to plan.  Final Clinical Impression(s) / ED Diagnoses Final diagnoses:  Bilateral low back pain with left-sided sciatica, unspecified chronicity    Rx / DC Orders ED Discharge Orders    None       Franchot Heidelberg, PA-C 04/15/20 2219    Drenda Freeze, MD 04/15/20 2259

## 2020-04-15 NOTE — Discharge Instructions (Signed)
Continue taking home medications as prescribed. Do the back exercises to help with pain control. Use heat or ice to help with pain, whichever one helps more. Follow-up with your pain clinic and/or your orthopedic doctor for further management of your pain. Make sure you continue to move gently, however do not do any strenuous activity with the neck several days. Return to the emergency room if you develop high fevers, severe worsening pain, loss of bowel bladder control, or any new, worsening, or concerning symptoms

## 2020-04-29 ENCOUNTER — Other Ambulatory Visit: Payer: Self-pay

## 2020-04-29 ENCOUNTER — Encounter (HOSPITAL_BASED_OUTPATIENT_CLINIC_OR_DEPARTMENT_OTHER): Payer: Self-pay | Admitting: Emergency Medicine

## 2020-04-29 ENCOUNTER — Emergency Department (HOSPITAL_BASED_OUTPATIENT_CLINIC_OR_DEPARTMENT_OTHER)
Admission: EM | Admit: 2020-04-29 | Discharge: 2020-04-29 | Disposition: A | Discharge status: Home / Self Care | Payer: 59 | Attending: Emergency Medicine | Admitting: Emergency Medicine

## 2020-04-29 DIAGNOSIS — M25511 Pain in right shoulder: Secondary | ICD-10-CM | POA: Insufficient documentation

## 2020-04-29 DIAGNOSIS — G8929 Other chronic pain: Secondary | ICD-10-CM | POA: Diagnosis not present

## 2020-04-29 DIAGNOSIS — M5440 Lumbago with sciatica, unspecified side: Secondary | ICD-10-CM | POA: Diagnosis not present

## 2020-04-29 DIAGNOSIS — F17210 Nicotine dependence, cigarettes, uncomplicated: Secondary | ICD-10-CM | POA: Diagnosis not present

## 2020-04-29 DIAGNOSIS — M545 Low back pain, unspecified: Secondary | ICD-10-CM | POA: Diagnosis present

## 2020-04-29 MED ORDER — LIDOCAINE 5 % EX PTCH
1.0000 | MEDICATED_PATCH | CUTANEOUS | Status: DC
  Administered 2020-04-29: 1 via TRANSDERMAL
  Filled 2020-04-29: qty 1

## 2020-04-29 MED ORDER — DEXAMETHASONE SODIUM PHOSPHATE 10 MG/ML IJ SOLN
8.0000 mg | Freq: Once | INTRAMUSCULAR | Status: AC
  Administered 2020-04-29: 8 mg via INTRAMUSCULAR
  Filled 2020-04-29: qty 1

## 2020-04-29 MED ORDER — OXYCODONE-ACETAMINOPHEN 5-325 MG PO TABS
1.0000 | ORAL_TABLET | Freq: Once | ORAL | Status: AC
  Administered 2020-04-29: 2 via ORAL
  Filled 2020-04-29: qty 2

## 2020-04-29 MED ORDER — ONDANSETRON 4 MG PO TBDP
4.0000 mg | ORAL_TABLET | Freq: Once | ORAL | Status: AC
  Administered 2020-04-29: 4 mg via ORAL

## 2020-04-29 MED ORDER — ONDANSETRON HCL 4 MG PO TABS
4.0000 mg | ORAL_TABLET | Freq: Once | ORAL | Status: DC
  Filled 2020-04-29: qty 1

## 2020-04-29 MED ORDER — KETOROLAC TROMETHAMINE 30 MG/ML IJ SOLN
30.0000 mg | Freq: Once | INTRAMUSCULAR | Status: AC
  Administered 2020-04-29: 30 mg via INTRAMUSCULAR
  Filled 2020-04-29: qty 1

## 2020-04-29 MED ORDER — ONDANSETRON 4 MG PO TBDP
ORAL_TABLET | ORAL | Status: AC
  Filled 2020-04-29: qty 1

## 2020-04-29 NOTE — Discharge Instructions (Addendum)
Please continue taking your prescribed medications as directed. Follow closely with your specialist regarding your worsening pain.

## 2020-04-29 NOTE — ED Provider Notes (Signed)
[facility] Provider Note   CSN: [id number] Arrival date & time: 04/29/20  2031     History Chief Complaint  Patient presents with  . Back Pain    Shelby Moreno is a 50 y.o. female w PMHx chronic back pain and sciatica follow by pain clinic and sports medicine, presenting for acute on chronic back pain. Patient was evaluated earlier this month on the sixth for similar symptoms.  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  Patient has generalized low back pain radiating down the left leg with some chronic numbness sensation.  Also reports right-sided shoulder pain which is also chronic. Symptoms worsened more so Friday night.  No new neuro symptoms, fever, urinary symptoms, or recent injury.  No bowel or bladder incontinence, or saddle paresthesias.  Pain is worse with movement.  She is prescribed oxycodone, Flexeril, tizanidine, gabapentin.  Has not treated her symptoms at home today at all prior to arrival.  States she was having pain moving from her bed, however arrives via private vehicle today with her friends that lives with her.  States her physician has recommended lumbar fusion, however she is not decided yet.  The history is provided by the patient and medical records.       Past Medical History:  Diagnosis Date  . Anxiety   . Back pain   . Depression   . Migraine   . Vertigo     Patient Active Problem List   Diagnosis Date Noted  . Rib pain on left side 06/06/2019  . 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 Trigger thumb of right hand 03/04/2016  . Right arm pain 11/24/2015  . 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 Neck pain 01/04/2014  . Low back pain 01/04/2014    Past Surgical History:  Procedure Laterality Date  . ABDOMINAL HYSTERECTOMY    . ABLATION    . CESAREAN SECTION    . TUBAL LIGATION       OB History   No obstetric  history on file.     Family History  Problem Relation Age of Onset  . Diabetes Mother   . Diabetes Father   . Kidney disease Father     Social History   Tobacco Use  . Smoking status: Current Every Day Smoker    Packs/day: 0.25    Years: 27.00    Pack years: 6.75    Types: Cigarettes  . Smokeless tobacco: Never Used  Vaping Use  . Vaping Use: Never used  Substance Use Topics  . Alcohol use: Not Currently  . Drug use: No    Home Medications Prior to Admission medications   Medication Sig Start Date End Date Taking? Authorizing Provider  cyclobenzaprine (FLEXERIL) 10 MG tablet Take 1 tablet (10 mg total) by mouth 2 (two) times daily as needed. 12/20/18  Yes Rosemarie Ax, MD  gabapentin (NEURONTIN) 100 MG capsule SMARTSIG:1 Capsule(s) By Mouth 5 Times Daily PRN 04/18/19  Yes [provider]  Diclofenac Sodium (PENNSAID) 2 % SOLN Place 1 application onto the skin 2 (two) times daily. 09/09/18   Rosemarie Ax, MD  doxycycline (VIBRAMYCIN) 100 MG capsule Take 1 capsule (100 mg total) by mouth 2 (two) times daily. One po bid x 7 days 07/10/17   Palumbo, April, MD  glycopyrrolate (ROBINUL) 1 MG tablet Take 1 tablet (1 mg total) by mouth  2 (two) times daily. 12/30/16   Ladene Artist, MD  HYDROcodone-acetaminophen (NORCO/VICODIN) 5-325 MG tablet Take 1 tablet by mouth 2 (two) times daily as needed for moderate pain. 12/06/18   Rosemarie Ax, MD  hydrOXYzine (ATARAX/VISTARIL) 25 MG tablet Take 25 mg by mouth 2 (two) times daily as needed. 04/19/19   [provider]  Ibuprofen-Famotidine 800-26.6 MG TABS Take 1 tablet by mouth 3 (three) times daily. 06/06/19   Rosemarie Ax, MD  lidocaine (LIDODERM) 5 % Place 1 patch onto the skin every 12 (twelve) hours. Remove & Discard patch within 12 hours or as directed by MD 06/06/19   Rosemarie Ax, MD  meclizine (ANTIVERT) 25 MG tablet Take 1 tablet (25 mg total) by mouth 3 (three) times daily as needed for  dizziness. 04/07/17   Horton, Barbette Hair, MD  ofloxacin (FLOXIN) 0.3 % OTIC solution Place 5 drops into the left ear 2 (two) times daily. 05/05/19   Walisiewicz, Kaitlyn E, PA-C  ondansetron (ZOFRAN) 8 MG tablet Take 8 mg by mouth every 8 (eight) hours as needed. 04/19/19   [provider]  Oxycodone HCl 10 MG TABS Take 10 mg by mouth 3 (three) times daily as needed. 04/20/19   [provider]  predniSONE (DELTASONE) 5 MG tablet Take 6 pills for first day, 5 pills second day, 4 pills third day, 3 pills fourth day, 2 pills the fifth day, and 1 pill sixth day. 10/07/18   Rosemarie Ax, MD  tizanidine (ZANAFLEX) 2 MG capsule TAKE 1 TO 2 CAPSULES BY MOUTH TWICE DAILY AS NEEDED 03/27/20   [provider]  tiZANidine (ZANAFLEX) 4 MG tablet Take 1 tablet (4 mg total) by mouth every 8 (eight) hours as needed for muscle spasms. 01/27/17   Dene Gentry, MD  traZODone (DESYREL) 50 MG tablet Take 50 mg by mouth at bedtime. 03/14/19   [provider]  valACYclovir (VALTREX) 500 MG tablet  03/08/19   [provider]    Allergies    Augmentin [amoxicillin-pot clavulanate], Keflex [cephalexin], Naproxen, Tramadol, Zithromax [azithromycin], and Morphine and related  Review of Systems   Review of Systems  Musculoskeletal: Positive for back pain.  All other systems reviewed and are negative.   Physical Exam Updated Vital Signs BP (!) 150/106 (BP Location: Right Arm)   Pulse 80   Temp 98.1 F (36.7 C) (Oral)   Resp 18   LMP 05/21/2016   SpO2 100%   Physical Exam Vitals and nursing note reviewed.  Constitutional:      Appearance: She is well-developed.  HENT:     Head: Normocephalic and atraumatic.  Eyes:     Conjunctiva/sclera: Conjunctivae normal.  Cardiovascular:     Rate and Rhythm: Normal rate and regular rhythm.  Pulmonary:     Effort: Pulmonary effort is normal. No respiratory distress.     Breath sounds: Normal breath sounds.  Abdominal:      Palpations: Abdomen is soft.  Musculoskeletal:     Comments: Generalized tenderness to right thoracic back, as well as bilateral lumbar region.  Straight leg raise to the left.  Skin:    General: Skin is warm.  Neurological:     Mental Status: She is alert.     Comments: Patient reports mildly decreased sensation to the left lower extremity which is unchanged and chronic. Patient is unwilling to perform strength tests with dorsi and plantar flexion due to pain in her back.  Steady gait  Psychiatric:  Behavior: Behavior normal.     ED Results / Procedures / Treatments   Labs (all labs ordered are listed, but only abnormal results are displayed) Labs Reviewed - No data to display  EKG None  Radiology No results found.  Procedures Procedures   Medications Ordered in ED Medications  lidocaine (LIDODERM) 5 % 1 patch (1 patch Transdermal Patch Applied 04/29/20 2217)  ondansetron (ZOFRAN-ODT) 4 MG disintegrating tablet (has no administration in time range)  ketorolac (TORADOL) 30 MG/ML injection 30 mg (30 mg Intramuscular Given 04/29/20 2217)  dexamethasone (DECADRON) injection 8 mg (8 mg Intramuscular Given 04/29/20 2217)  oxyCODONE-acetaminophen (PERCOCET/ROXICET) 5-325 MG per tablet 1-2 tablet (2 tablets Oral Given 04/29/20 2234)  ondansetron (ZOFRAN-ODT) disintegrating tablet 4 mg (4 mg Oral Given 04/29/20 2227)    ED Course  I have reviewed the triage vital signs and the nursing notes.  Pertinent labs & imaging results that were available during my care of the patient were reviewed by me and considered in my medical decision making (see chart for details).    MDM Rules/Calculators/A&P                         Patient with history of chronic back pain followed by pain clinic and sports medicine, prescribed Percocet, gabapentin, 2 muscle relaxers.  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  No new neuro symptoms or deficits.   She can walk but states it is painful.  Has not treated her pain at home today.  She is given a dose of Decadron, Toradol, home dose of Percocet and a Lidoderm patch as she reports the patch provided relief during last ED visit.  Encouraged to use Salonpas patches with lidocaine at home take her medications as prescribed and follow-up outpatient.  She is discharged in no distress and agreeable with plan  Discussed results, findings, treatment and follow up. Patient advised of return precautions. Patient verbalized understanding and agreed with plan.  Final Clinical Impression(s) / ED Diagnoses Final diagnoses:  Chronic bilateral low back pain with sciatica, sciatica laterality unspecified    Rx / DC Orders ED Discharge Orders    None       , [place] N, PA-C 04/29/20 2319    Quintella Reichert, MD 05/01/20 [phone]

## 2020-04-29 NOTE — ED Notes (Signed)
ED Provider at bedside. 

## 2020-04-29 NOTE — ED Triage Notes (Signed)
Pt reports chronic pain, injured again at work on the 6th, was seen here for it but is still having pain; pain radiates to the right shoulder and is making pt "legs tingly"; pt ambulatory and denies bowel or bladder incontinence

## 2020-05-02 ENCOUNTER — Ambulatory Visit (INDEPENDENT_AMBULATORY_CARE_PROVIDER_SITE_OTHER): Payer: No Typology Code available for payment source | Admitting: Family Medicine

## 2020-05-02 ENCOUNTER — Encounter: Payer: Self-pay | Admitting: Family Medicine

## 2020-05-02 ENCOUNTER — Other Ambulatory Visit: Payer: Self-pay

## 2020-05-02 DIAGNOSIS — M5442 Lumbago with sciatica, left side: Secondary | ICD-10-CM

## 2020-05-02 NOTE — Assessment & Plan Note (Addendum)
Acutely worsening and exacerbated by her work. -Counseled on home exercise therapy and supportive therapy -Work note was provided. -Referral to neurosurgery as she does have radicular type pain down the left leg.

## 2020-05-02 NOTE — Patient Instructions (Signed)
Good to see you Please try heat  Please try the range of motion movements   Please send me a message in MyChart with any questions or updates.  Please see me back in 4 weeks.   --Dr. Raeford Razor

## 2020-05-02 NOTE — Progress Notes (Signed)
  Shelby Moreno - 50 y.o. female MRN [id number]  Date of birth: 12-23-1970  SUBJECTIVE:  Including CC & ROS.  No chief complaint on file.   Shelby Moreno is a 50 y.o. female that is presenting with acute on chronic low back pain with left-sided radicular type pain.  Her symptoms were exacerbated by her activities at work.  She has been seen in the emergency department a few different times.  Previous MRI has shown changes in the lower lumbar spine.   Review of Systems See HPI   HISTORY: Past Medical, Surgical, Social, and Family History Reviewed & Updated per EMR.   Pertinent Historical Findings include:  Past Medical History:  Diagnosis Date  . Anxiety   . Back pain   . Depression   . Migraine   . Vertigo     Past Surgical History:  Procedure Laterality Date  . ABDOMINAL HYSTERECTOMY    . ABLATION    . CESAREAN SECTION    . TUBAL LIGATION      Family History  Problem Relation Age of Onset  . Diabetes Mother   . Diabetes Father   . Kidney disease Father     Social History   Socioeconomic History  . Marital status: Legally Separated    Spouse name: Not on file  . Number of children: 2  . Years of education: Not on file  . Highest education level: Not on file  Occupational History  . Occupation: Chartered certified accountant  Tobacco Use  . Smoking status: Current Every Day Smoker    Packs/day: 0.25    Years: 27.00    Pack years: 6.75    Types: Cigarettes  . Smokeless tobacco: Never Used  Vaping Use  . Vaping Use: Never used  Substance and Sexual Activity  . Alcohol use: Not Currently  . Drug use: No  . Sexual activity: Not on file  Other Topics Concern  . Not on file  Social History Narrative  . Not on file   Social Determinants of Health   Financial Resource Strain: Not on file  Food Insecurity: Not on file  Transportation Needs: Not on file  Physical Activity: Not on file  Stress: Not on file  Social Connections: Not on file  Intimate Partner Violence: Not on  file     PHYSICAL EXAM:  VS: BP (!) 148/80 (BP Location: Left Arm, Patient Position: Sitting, Cuff Size: Large)   Ht 5\' 7"  (1.702 m)   Wt 162 lb (73.5 kg)   LMP 05/21/2016   BMI 25.37 kg/m  Physical Exam Gen: NAD, alert, cooperative with exam, well-appearing MSK:  Back: Limited flexion extension. Normal strength resistance. Normal gait. Neurovascular intact     ASSESSMENT & PLAN:   Low back pain Acutely worsening and exacerbated by her work. -Counseled on home exercise therapy and supportive therapy -Work note was provided. -Referral to neurosurgery as she does have radicular type pain down the left leg.

## 2020-05-03 ENCOUNTER — Telehealth: Payer: Self-pay | Admitting: Family Medicine

## 2020-05-03 NOTE — Telephone Encounter (Signed)
Patient called ask if we had the appt scheduled w/ Neurologist per Dr.Schmitz--advised pt that her WC case msg or rep should be the one setting up appt .  I called [facility] @[phone] spoke w/[facility] facility Director he states has advised Ms.Moan several times they have a designated [facility] provider & that is where she should be going for any WC incidents ( Patient was scheduled to go there today @ 3pm per him) --He took my nm & contact info called @ gave [facility] provider as [facility] @ [phone]

## 2020-05-29 ENCOUNTER — Ambulatory Visit: Payer: 59 | Admitting: Family Medicine

## 2020-05-30 ENCOUNTER — Ambulatory Visit: Payer: 59 | Admitting: Family Medicine

## 2020-05-30 ENCOUNTER — Encounter: Payer: Self-pay | Admitting: Family Medicine

## 2020-05-30 ENCOUNTER — Other Ambulatory Visit: Payer: Self-pay

## 2020-05-30 VITALS — BP 134/96 | Ht 67.0 in | Wt 162.0 lb

## 2020-05-30 DIAGNOSIS — M5442 Lumbago with sciatica, left side: Secondary | ICD-10-CM | POA: Diagnosis not present

## 2020-05-30 NOTE — Progress Notes (Signed)
  [name] - 50 y.o. female MRN [id number]  Date of birth: March 29, 1970  SUBJECTIVE:  Including CC & ROS.  No chief complaint on file.   Shelby Moreno is a 50 y.o. female that is presenting with ongoing low back pain and left-sided sciatica.  She has tried facet injections and epidural.  Her Worker's Compensation claim was denied.  Her symptoms are exacerbated with activities at work with turning patients or doing any significant lifting.   Review of Systems See HPI   HISTORY: Past Medical, Surgical, Social, and Family History Reviewed & Updated per EMR.   Pertinent Historical Findings include:  Past Medical History:  Diagnosis Date  . Anxiety   . Back pain   . Depression   . Migraine   . Vertigo     Past Surgical History:  Procedure Laterality Date  . ABDOMINAL HYSTERECTOMY    . ABLATION    . CESAREAN SECTION    . TUBAL LIGATION      Family History  Problem Relation Age of Onset  . Diabetes Mother   . Diabetes Father   . Kidney disease Father     Social History   Socioeconomic History  . Marital status: Legally Separated    Spouse name: Not on file  . Number of children: 2  . Years of education: Not on file  . Highest education level: Not on file  Occupational History  . Occupation: Chartered certified accountant  Tobacco Use  . Smoking status: Current Every Day Smoker    Packs/day: 0.25    Years: 27.00    Pack years: 6.75    Types: Cigarettes  . Smokeless tobacco: Never Used  Vaping Use  . Vaping Use: Never used  Substance and Sexual Activity  . Alcohol use: Not Currently  . Drug use: No  . Sexual activity: Not on file  Other Topics Concern  . Not on file  Social History Narrative  . Not on file   Social Determinants of Health   Financial Resource Strain: Not on file  Food Insecurity: Not on file  Transportation Needs: Not on file  Physical Activity: Not on file  Stress: Not on file  Social Connections: Not on file  Intimate Partner Violence: Not on file      PHYSICAL EXAM:  VS: BP (!) 134/96 (BP Location: Left Arm, Patient Position: Sitting, Cuff Size: Normal)   Ht 5\' 7"  (1.702 m)   Wt 162 lb (73.5 kg)   LMP 05/21/2016   BMI 25.37 kg/m  Physical Exam Gen: NAD, alert, cooperative with exam, well-appearing   ASSESSMENT & PLAN:   Low back pain Acute on chronic in nature.  Exacerbated with activity related to lifting. -Counseled on home exercise therapy and supportive care. -Provided work note. -Referral to neurosurgery - could consider [facility] techonology

## 2020-05-30 NOTE — Assessment & Plan Note (Addendum)
Acute on chronic in nature.  Exacerbated with activity related to lifting. -Counseled on home exercise therapy and supportive care. -Provided work note. -Referral to neurosurgery - could consider [facility] techonology

## 2020-05-30 NOTE — Patient Instructions (Signed)
Good to see you Please try heat as needed  Please try to see what neurosurgery has to say. Please send me a message in MyChart with any questions or updates.  Please see me back in 10 weeks.   --Dr. Raeford Razor

## 2020-06-04 ENCOUNTER — Encounter: Payer: Self-pay | Admitting: Family Medicine

## 2020-08-20 ENCOUNTER — Other Ambulatory Visit: Payer: Self-pay

## 2020-08-20 ENCOUNTER — Encounter: Payer: Self-pay | Admitting: Family Medicine

## 2020-08-20 ENCOUNTER — Ambulatory Visit (INDEPENDENT_AMBULATORY_CARE_PROVIDER_SITE_OTHER): Payer: 59 | Admitting: Family Medicine

## 2020-08-20 DIAGNOSIS — M5442 Lumbago with sciatica, left side: Secondary | ICD-10-CM | POA: Diagnosis not present

## 2020-08-20 NOTE — Patient Instructions (Signed)
Good to see you Please let me know how the epidural works   Please send me a message in [place] with any questions or updates.  Please see me back in 10 weeks.   --Dr. Raeford Razor

## 2020-08-20 NOTE — Progress Notes (Signed)
  Shelby Moreno - 50 y.o. female MRN [id number]  Date of birth: 01/29/1971  SUBJECTIVE:  Including CC & ROS.  No chief complaint on file.   Shelby Moreno is a 50 y.o. female that is following up for her low back pain.  Has been seen by the neurosurgeon.  She reports that the pain is still occurring intermittently.  She has been out of work since May.   Review of Systems See HPI   HISTORY: Past Medical, Surgical, Social, and Family History Reviewed & Updated per EMR.   Pertinent Historical Findings include:  Past Medical History:  Diagnosis Date  . Anxiety   . Back pain   . Depression   . Migraine   . Vertigo     Past Surgical History:  Procedure Laterality Date  . ABDOMINAL HYSTERECTOMY    . ABLATION    . CESAREAN SECTION    . TUBAL LIGATION      Family History  Problem Relation Age of Onset  . Diabetes Mother   . Diabetes Father   . Kidney disease Father     Social History   Socioeconomic History  . Marital status: Legally Separated    Spouse name: Not on file  . Number of children: 2  . Years of education: Not on file  . Highest education level: Not on file  Occupational History  . Occupation: Chartered certified accountant  Tobacco Use  . Smoking status: Every Day    Packs/day: 0.25    Years: 27.00    Pack years: 6.75    Types: Cigarettes  . Smokeless tobacco: Never  Vaping Use  . Vaping Use: Never used  Substance and Sexual Activity  . Alcohol use: Not Currently  . Drug use: No  . Sexual activity: Not on file  Other Topics Concern  . Not on file  Social History Narrative  . Not on file   Social Determinants of Health   Financial Resource Strain: Not on file  Food Insecurity: Not on file  Transportation Needs: Not on file  Physical Activity: Not on file  Stress: Not on file  Social Connections: Not on file  Intimate Partner Violence: Not on file     PHYSICAL EXAM:  VS: BP (!) 140/94 (BP Location: Left Arm, Patient Position: Sitting, Cuff Size: Normal)    Ht 5\' 7"  (1.702 m)   Wt 162 lb (73.5 kg)   LMP 05/21/2016   BMI 25.37 kg/m  Physical Exam Gen: NAD, alert, cooperative with exam, well-appearing     ASSESSMENT & PLAN:   Low back pain Has been seen by the neurosurgeon and is planned for a second epidural.  Pain still occurring intermittently.  She has discussions about further possible treatments if pain is still ongoing. -Counseled on home exercise therapy and supportive care. -Provided work note.

## 2020-08-21 NOTE — Assessment & Plan Note (Signed)
Has been seen by the neurosurgeon and is planned for a second epidural.  Pain still occurring intermittently.  She has discussions about further possible treatments if pain is still ongoing. -Counseled on home exercise therapy and supportive care. -Provided work note.

## 2020-10-29 ENCOUNTER — Other Ambulatory Visit: Payer: Self-pay

## 2020-10-29 ENCOUNTER — Encounter: Payer: Self-pay | Admitting: Family Medicine

## 2020-10-29 ENCOUNTER — Ambulatory Visit (INDEPENDENT_AMBULATORY_CARE_PROVIDER_SITE_OTHER): Payer: 59 | Admitting: Family Medicine

## 2020-10-29 VITALS — Ht 67.0 in | Wt 162.0 lb

## 2020-10-29 DIAGNOSIS — M5442 Lumbago with sciatica, left side: Secondary | ICD-10-CM | POA: Diagnosis not present

## 2020-10-29 DIAGNOSIS — G6289 Other specified polyneuropathies: Secondary | ICD-10-CM

## 2020-10-29 NOTE — Patient Instructions (Signed)
Good to see you Please try increasing the gabapentin to 300 mg three times per day.  We will send you to neurology for nerve studies   Please send me a message in MyChart with any questions or updates.  We will schedule a virtual visit once the nerve studies are resulted.   --Dr. Raeford Razor

## 2020-10-29 NOTE — Progress Notes (Signed)
  Shelby Moreno - 50 y.o. female MRN [id number]  Date of birth: 02-04-1971  SUBJECTIVE:  Including CC & ROS.  No chief complaint on file.   Shelby Moreno is a 50 y.o. female that is  following up her for low back pain with radicular type pain. She has tried eipdurals with improvement.    Review of Systems See HPI   HISTORY: Past Medical, Surgical, Social, and Family History Reviewed & Updated per EMR.   Pertinent Historical Findings include:  Past Medical History:  Diagnosis Date   Anxiety    Back pain    Depression    Migraine    Vertigo     Past Surgical History:  Procedure Laterality Date   ABDOMINAL HYSTERECTOMY     ABLATION     CESAREAN SECTION     TUBAL LIGATION      Family History  Problem Relation Age of Onset   Diabetes Mother    Diabetes Father    Kidney disease Father     Social History   Socioeconomic History   Marital status: Legally Separated    Spouse name: Not on file   Number of children: 2   Years of education: Not on file   Highest education level: Not on file  Occupational History   Occupation: Nurse Tech  Tobacco Use   Smoking status: Every Day    Packs/day: 0.25    Years: 27.00    Pack years: 6.75    Types: Cigarettes   Smokeless tobacco: Never  Vaping Use   Vaping Use: Never used  Substance and Sexual Activity   Alcohol use: Not Currently   Drug use: No   Sexual activity: Not on file  Other Topics Concern   Not on file  Social History Narrative   Not on file   Social Determinants of Health   Financial Resource Strain: Not on file  Food Insecurity: Not on file  Transportation Needs: Not on file  Physical Activity: Not on file  Stress: Not on file  Social Connections: Not on file  Intimate Partner Violence: Not on file     PHYSICAL EXAM:  VS: Ht '5\' 7"'$  (1.702 m)   Wt 162 lb (73.5 kg)   LMP 05/21/2016   BMI 25.37 kg/m  Physical Exam Gen: NAD, alert, cooperative with exam, well-appearing      ASSESSMENT &  PLAN:   Low back pain Acute on chronic in nature.  Still having pain in the lower back with radicular type pain.  Has been out of work recently.  No improvement with epidurals. -Counseled on home exercise therapy and supportive care. -Has tried physical therapy in the past and seems to exacerbate her symptoms. -Is currently taking pain medication by her pain management. -Referral to neurology for nerve study. -Could consider initiating Cymbalta. -Advised to increase gabapentin.

## 2020-10-30 NOTE — Assessment & Plan Note (Signed)
Acute on chronic in nature.  Still having pain in the lower back with radicular type pain.  Has been out of work recently.  No improvement with epidurals. -Counseled on home exercise therapy and supportive care. -Has tried physical therapy in the past and seems to exacerbate her symptoms. -Is currently taking pain medication by her pain management. -Referral to neurology for nerve study. -Could consider initiating Cymbalta. -Advised to increase gabapentin.

## 2020-11-01 ENCOUNTER — Ambulatory Visit: Payer: 59 | Admitting: Neurology

## 2020-11-02 ENCOUNTER — Encounter: Payer: Self-pay | Admitting: Family Medicine

## 2020-11-05 ENCOUNTER — Encounter: Payer: Self-pay | Admitting: Family Medicine

## 2021-04-20 ENCOUNTER — Other Ambulatory Visit: Payer: Self-pay

## 2021-04-20 ENCOUNTER — Emergency Department (HOSPITAL_BASED_OUTPATIENT_CLINIC_OR_DEPARTMENT_OTHER)
Admission: EM | Admit: 2021-04-20 | Discharge: 2021-04-20 | Disposition: A | Discharge status: Home / Self Care | Payer: 59 | Attending: Emergency Medicine | Admitting: Emergency Medicine

## 2021-04-20 ENCOUNTER — Encounter (HOSPITAL_BASED_OUTPATIENT_CLINIC_OR_DEPARTMENT_OTHER): Payer: Self-pay | Admitting: Emergency Medicine

## 2021-04-20 ENCOUNTER — Emergency Department (HOSPITAL_BASED_OUTPATIENT_CLINIC_OR_DEPARTMENT_OTHER): Payer: 59

## 2021-04-20 DIAGNOSIS — X503XXA Overexertion from repetitive movements, initial encounter: Secondary | ICD-10-CM | POA: Insufficient documentation

## 2021-04-20 DIAGNOSIS — R200 Anesthesia of skin: Secondary | ICD-10-CM | POA: Insufficient documentation

## 2021-04-20 DIAGNOSIS — S39011A Strain of muscle, fascia and tendon of abdomen, initial encounter: Secondary | ICD-10-CM

## 2021-04-20 DIAGNOSIS — M545 Low back pain, unspecified: Secondary | ICD-10-CM | POA: Insufficient documentation

## 2021-04-20 DIAGNOSIS — S76211A Strain of adductor muscle, fascia and tendon of right thigh, initial encounter: Secondary | ICD-10-CM | POA: Insufficient documentation

## 2021-04-20 NOTE — Discharge Instructions (Addendum)
You had an xray of your right femur today that showed some changes that will need to be rechecked with an xray in the next six months.  Please follow up with your family doctor for recheck.   ?

## 2021-04-20 NOTE — ED Notes (Signed)
Patient discharged to home.  All discharge instructions reviewed.  Patient verbalized understanding via teachback method.  VS WDL.  Respirations even and unlabored.  Ambulatory out of ED.   °

## 2021-04-20 NOTE — ED Provider Notes (Signed)
?[facility] EMERGENCY DEPARTMENT ?Provider Note ? ? ?CSN: [id number] ?Arrival date & time: 04/20/21  0144 ? ?  ? ?History ? ?Chief Complaint  ?Patient presents with  ? Leg Pain  ? ? ?MAURIANNA BENARD is a 51 y.o. female. ? ?The history is provided by the patient.  ?Leg Pain ?KHORI UNDERBERG is a 51 y.o. female who presents to the Emergency Department complaining of leg pain.  She presents to the emergency department accompanied by her significant other for evaluation of right leg pain.  Symptoms started Friday morning when she rolled over in bed.  Pain is located throughout the right upper leg and inner thigh.  She states that there is some bruising in the area.  Pain is constant but worse with walking.  She is able to walk.  She also reports a little bit of left calf pain but this feels different than of right leg pain.  She has chronic numbness in bilateral lower extremities.  She has mild low back pain and this is at her baseline.  She does have a history of prior lumbar surgery in October. ? ?She has no known medical problems. ? ?  ? ?Home Medications ?Prior to Admission medications   ?Medication Sig Start Date End Date Taking? Authorizing Provider  ?cyclobenzaprine (FLEXERIL) 10 MG tablet Take 1 tablet (10 mg total) by mouth 2 (two) times daily as needed. 12/20/18   Rosemarie Ax, MD  ?Diclofenac Sodium (PENNSAID) 2 % SOLN Place 1 application onto the skin 2 (two) times daily. 09/09/18   Rosemarie Ax, MD  ?doxycycline (VIBRAMYCIN) 100 MG capsule Take 1 capsule (100 mg total) by mouth 2 (two) times daily. One po bid x 7 days 07/10/17   Palumbo, April, MD  ?gabapentin (NEURONTIN) 100 MG capsule SMARTSIG:1 Capsule(s) By Mouth 5 Times Daily PRN 04/18/19   [provider]  ?glycopyrrolate (ROBINUL) 1 MG tablet Take 1 tablet (1 mg total) by mouth 2 (two) times daily. 12/30/16   Ladene Artist, MD  ?HYDROcodone-acetaminophen (NORCO/VICODIN) 5-325 MG tablet Take 1 tablet by mouth 2 (two) times  daily as needed for moderate pain. 12/06/18   Rosemarie Ax, MD  ?hydrOXYzine (ATARAX/VISTARIL) 25 MG tablet Take 25 mg by mouth 2 (two) times daily as needed. 04/19/19   [provider]  ?Ibuprofen-Famotidine 800-26.6 MG TABS Take 1 tablet by mouth 3 (three) times daily. 06/06/19   Rosemarie Ax, MD  ?lidocaine (LIDODERM) 5 % Place 1 patch onto the skin every 12 (twelve) hours. Remove & Discard patch within 12 hours or as directed by MD 06/06/19   Rosemarie Ax, MD  ?meclizine (ANTIVERT) 25 MG tablet Take 1 tablet (25 mg total) by mouth 3 (three) times daily as needed for dizziness. 04/07/17   Horton, Barbette Hair, MD  ?ofloxacin (FLOXIN) 0.3 % OTIC solution Place 5 drops into the left ear 2 (two) times daily. 05/05/19   Walisiewicz, Verline Lema E, PA-C  ?ondansetron (ZOFRAN) 8 MG tablet Take 8 mg by mouth every 8 (eight) hours as needed. 04/19/19   [provider]  ?Oxycodone HCl 10 MG TABS Take 10 mg by mouth 3 (three) times daily as needed. 04/20/19   [provider]  ?predniSONE (DELTASONE) 5 MG tablet Take 6 pills for first day, 5 pills second day, 4 pills third day, 3 pills fourth day, 2 pills the fifth day, and 1 pill sixth day. 10/07/18   Rosemarie Ax, MD  ?tizanidine (ZANAFLEX) 2 MG  capsule TAKE 1 TO 2 CAPSULES BY MOUTH TWICE DAILY AS NEEDED 03/27/20   [provider]  ?tiZANidine (ZANAFLEX) 4 MG tablet Take 1 tablet (4 mg total) by mouth every 8 (eight) hours as needed for muscle spasms. 01/27/17   Dene Gentry, MD  ?traZODone (DESYREL) 50 MG tablet Take 50 mg by mouth at bedtime. 03/14/19   [provider]  ?valACYclovir (VALTREX) 500 MG tablet  03/08/19   [provider]  ?   ? ?Allergies    ?Augmentin [amoxicillin-pot clavulanate], Keflex [cephalexin], Naproxen, Tramadol, Zithromax [azithromycin], and Morphine and related   ? ?Review of Systems   ?Review of Systems  ?All other systems reviewed and are negative. ? ?Physical Exam ?Updated Vital  Signs ?BP (!) 147/107   Pulse 87   Temp 98.2 ?F (36.8 ?C) (Oral)   Resp 19   Ht '5\' 6"'$  (1.676 m)   Wt 73.5 kg   LMP 05/21/2016   SpO2 99%   BMI 26.15 kg/m?  ?Physical Exam ?Vitals and nursing note reviewed.  ?Constitutional:   ?   Appearance: She is well-developed.  ?HENT:  ?   Head: Normocephalic and atraumatic.  ?Cardiovascular:  ?   Rate and Rhythm: Normal rate and regular rhythm.  ?Pulmonary:  ?   Effort: Pulmonary effort is normal. No respiratory distress.  ?Abdominal:  ?   Palpations: Abdomen is soft.  ?   Tenderness: There is no abdominal tenderness. There is no guarding or rebound.  ?Musculoskeletal:  ?   Comments: 2+ DP pulses bilaterally.  There is tenderness to palpation over the right thigh without discrete bony tenderness.  There is mild area of redness without warmth over the right inner upper thigh without induration.  This area is blanching.  There is no swelling to bilateral lower extremities.  She has 5 out of 5 strength in bilateral lower extremities but does have pain on hip flexion on the right leg.  ?Skin: ?   General: Skin is warm and dry.  ?Neurological:  ?   Mental Status: She is alert and oriented to person, place, and time.  ?Psychiatric:     ?   Behavior: Behavior normal.  ? ? ?ED Results / Procedures / Treatments   ?Labs ?(all labs ordered are listed, but only abnormal results are displayed) ?Labs Reviewed - No data to display ? ?EKG ?None ? ?Radiology ?DG Hip Unilat W or Wo Pelvis 2-3 Views Right ? ?Result Date: 04/20/2021 ?CLINICAL DATA:  Proximal femoral pain, hip pain EXAM: DG HIP (WITH OR WITHOUT PELVIS) 2-3V RIGHT COMPARISON:  None. FINDINGS: Hip joints and SI joints are symmetric. No acute bony abnormality. Specifically, no fracture, subluxation, or dislocation. IMPRESSION: No acute bony abnormality. Electronically Signed   By: Rolm Baptise M.D.   On: 04/20/2021 03:26  ? ?DG Femur Min 2 Views Right ? ?Result Date: 04/20/2021 ?CLINICAL DATA:  Pain.  No known injury. EXAM:  RIGHT FEMUR 2 VIEWS COMPARISON:  None. FINDINGS: Area of subtle increased density and contour bulge noted in the distal right femoral metaphysis. 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 No fracture, subluxation or dislocation. Joint spaces maintained. No joint effusion within the right knee. IMPRESSION: Area of sclerosis and contour bulge in the distal right femoral metaphysis, possibly related to old healed NOF. This could be followed with repeat plain films in 6 months to ensure stability or further characterized with MRI. Electronically Signed   By: Rolm Baptise M.D.   On: 04/20/2021 03:25   ? ?  Procedures ?Procedures  ? ? ?Medications Ordered in ED ?Medications - No data to display ? ?ED Course/ Medical Decision Making/ A&P ?  ?                        ?Medical Decision Making ?Amount and/or Complexity of Data Reviewed ?Radiology: ordered. ? ? ?Patient here for evaluation of right leg pain that started yesterday.  She is neurologically and vascularly intact on evaluation.  She has mild redness to the right inner thigh without evidence of secondary bacterial infection.  Current clinical picture is not consistent with DVT, limb ischemia, infection, zoster.  Images obtained, negative for acute fracture.  Images personally reviewed.  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  Discussed this finding with patient, recommend PCP follow-up for recheck.  Discussed home care for likely muscle strain.  Recommend NSAIDs, heat/cool therapy as needed, muscle relaxers.  Patient states she has these available at home.   ? ? ? ? ? ? ? ?Final Clinical Impression(s) / ED Diagnoses ?Final diagnoses:  ?Strain of muscle of right groin region  ? ? ?Rx / DC Orders ?ED Discharge Orders   ? ? None  ? ?  ? ? ?  ?Quintella Reichert, MD ?04/20/21 0448 ? ?

## 2021-04-20 NOTE — ED Triage Notes (Signed)
BL leg pain since this morning. Describes pain as sharp and radiating down legs. Bruising present in right groin. Denies trauma.  ?

## 2021-05-02 ENCOUNTER — Ambulatory Visit (INDEPENDENT_AMBULATORY_CARE_PROVIDER_SITE_OTHER): Payer: 59 | Admitting: Family Medicine

## 2021-05-02 VITALS — BP 122/80 | Ht 66.0 in | Wt 170.0 lb

## 2021-05-02 DIAGNOSIS — M7631 Iliotibial band syndrome, right leg: Secondary | ICD-10-CM | POA: Diagnosis not present

## 2021-05-02 MED ORDER — PREDNISONE 5 MG PO TABS: ORAL_TABLET | ORAL | 0 refills | Status: DC

## 2021-05-02 NOTE — Assessment & Plan Note (Signed)
Acutely occurring. Pain most consistent with IT band with pain over the lateral aspect.  ?- counseled on home exercise therapy and supportive care ?- green sport insoles  ?- prednisone  ?- could consider injection or physical therapy.  ? ?

## 2021-05-02 NOTE — Progress Notes (Signed)
?  Shelby Moreno - 51 y.o. female MRN [id number]  Date of birth: January 06, 1971 ? ?SUBJECTIVE:  Including CC & ROS.  ?No chief complaint on file. ? ? ?Shelby Moreno is a 51 y.o. female that is  presenting with bilateral knee pain. The pain is lateral aspect of each knee. Has been occurring since November. Worse with twisting. ? ?Review of the labs and department note from 3/11 shows she was counseled on supportive care. ?Independent review of the right hip x-ray from 3/11 showed no acute changes. ?Independent review of the right femur x-ray from 3/11 shows area of thickening of the distal femoral metaphysis. ? ? ?Review of Systems ?See HPI  ? ?HISTORY: Past Medical, Surgical, Social, and Family History Reviewed & Updated per EMR.   ?Pertinent Historical Findings include: ? ?Past Medical History:  ?Diagnosis Date  ? Anxiety   ? Back pain   ? Depression   ? Migraine   ? Vertigo   ? ? ?Past Surgical History:  ?Procedure Laterality Date  ? ABDOMINAL HYSTERECTOMY    ? ABLATION    ? CESAREAN SECTION    ? TUBAL LIGATION    ? ? ? ?PHYSICAL EXAM:  ?VS: BP 122/80   Ht '5\' 6"'$  (1.676 m)   Wt 170 lb (77.1 kg)   LMP 05/21/2016   BMI 27.44 kg/m?  ?Physical Exam ?Gen: NAD, alert, cooperative with exam, well-appearing ?MSK:  ?Neurovascularly intact   ? ? ? ? ?ASSESSMENT & PLAN:  ? ?It band syndrome, right ?Acutely occurring. Pain most consistent with IT band with pain over the lateral aspect.  ?- counseled on home exercise therapy and supportive care ?- green sport insoles  ?- prednisone  ?- could consider injection or physical therapy.  ? ? ? ? ?

## 2021-05-02 NOTE — Patient Instructions (Signed)
Good to see you ?Please try ice as needed  ?Please try the exercises   ?Please send me a message in MyChart with any questions or updates.  ?Please see me back in 4-6 weeks.  ? ?--Dr. Raeford Razor ? ?

## 2021-05-07 ENCOUNTER — Encounter (HOSPITAL_BASED_OUTPATIENT_CLINIC_OR_DEPARTMENT_OTHER): Payer: Self-pay

## 2021-05-07 ENCOUNTER — Other Ambulatory Visit: Payer: Self-pay

## 2021-05-07 ENCOUNTER — Emergency Department (HOSPITAL_BASED_OUTPATIENT_CLINIC_OR_DEPARTMENT_OTHER)
Admission: EM | Admit: 2021-05-07 | Discharge: 2021-05-07 | Disposition: A | Discharge status: Home / Self Care | Payer: 59 | Attending: Emergency Medicine | Admitting: Emergency Medicine

## 2021-05-07 DIAGNOSIS — F17200 Nicotine dependence, unspecified, uncomplicated: Secondary | ICD-10-CM | POA: Insufficient documentation

## 2021-05-07 DIAGNOSIS — D72829 Elevated white blood cell count, unspecified: Secondary | ICD-10-CM | POA: Insufficient documentation

## 2021-05-07 DIAGNOSIS — R42 Dizziness and giddiness: Secondary | ICD-10-CM | POA: Insufficient documentation

## 2021-05-07 DIAGNOSIS — R519 Headache, unspecified: Secondary | ICD-10-CM | POA: Insufficient documentation

## 2021-05-07 LAB — BASIC METABOLIC PANEL
Anion gap: 7 (ref 5–15)
BUN: 8 mg/dL (ref 6–20)
CO2: 27 mmol/L (ref 22–32)
Calcium: 9.7 mg/dL (ref 8.9–10.3)
Chloride: 107 mmol/L (ref 98–111)
Creatinine, Ser: 0.81 mg/dL (ref 0.44–1.00)
GFR, Estimated: >60 mL/min (ref >60)
Glucose, Bld: 95 mg/dL (ref 70–99)
Potassium: 4.1 mmol/L (ref 3.5–5.1)
Sodium: 141 mmol/L (ref 135–145)

## 2021-05-07 LAB — CBC WITH DIFFERENTIAL/PLATELET
Abs Immature Granulocytes: 0.03 10*3/uL (ref 0.00–0.07)
Basophils Absolute: 0 10*3/uL (ref 0.0–0.1)
Basophils Relative: 0 %
Eosinophils Absolute: 0.2 10*3/uL (ref 0.0–0.5)
Eosinophils Relative: 2 %
HCT: 43.3 % (ref 36.0–46.0)
Hemoglobin: 14.7 g/dL (ref 12.0–15.0)
Immature Granulocytes: 0 %
Lymphocytes Relative: 30 %
Lymphs Abs: 3.1 10*3/uL (ref 0.7–4.0)
MCH: 29.7 pg (ref 26.0–34.0)
MCHC: 33.9 g/dL (ref 30.0–36.0)
MCV: 87.5 fL (ref 80.0–100.0)
Monocytes Absolute: 0.6 10*3/uL (ref 0.1–1.0)
Monocytes Relative: 6 %
Neutro Abs: 6.6 10*3/uL (ref 1.7–7.7)
Neutrophils Relative %: 62 %
Platelets: 193 10*3/uL (ref 150–400)
RBC: 4.95 MIL/uL (ref 3.87–5.11)
RDW: 13.8 % (ref 11.5–15.5)
WBC: 10.6 10*3/uL — ABNORMAL HIGH (ref 4.0–10.5)
nRBC: 0 % (ref 0.0–0.2)

## 2021-05-07 LAB — CBG MONITORING, ED: Glucose-Capillary: 78 mg/dL (ref 70–99)

## 2021-05-07 MED ORDER — PROCHLORPERAZINE EDISYLATE 10 MG/2ML IJ SOLN
10.0000 mg | Freq: Once | INTRAMUSCULAR | Status: AC
  Administered 2021-05-07: 10 mg via INTRAMUSCULAR
  Filled 2021-05-07: qty 2

## 2021-05-07 MED ORDER — MECLIZINE HCL 25 MG PO TABS
25.0000 mg | ORAL_TABLET | Freq: Once | ORAL | Status: AC
  Administered 2021-05-07: 25 mg via ORAL
  Filled 2021-05-07: qty 1

## 2021-05-07 MED ORDER — MECLIZINE HCL 25 MG PO TABS: 25.0000 mg | ORAL_TABLET | Freq: Three times a day (TID) | ORAL | 0 refills | Status: AC | PRN

## 2021-05-07 MED ORDER — ONDANSETRON 4 MG PO TBDP: 4.0000 mg | ORAL_TABLET | Freq: Three times a day (TID) | ORAL | 0 refills | Status: DC | PRN

## 2021-05-07 NOTE — ED Notes (Signed)
Reviewed discharge instructions with patient. States understanding. Pt aware of prescriptions sent to pharmacy.   ?

## 2021-05-07 NOTE — Discharge Instructions (Signed)
Please return to the emergency department for any worsening symptoms including facial droop, focal weakness or numbness, trouble walking, trouble speaking, or any other concerns you might have. ?

## 2021-05-07 NOTE — ED Provider Notes (Signed)
?[facility] ?Provider Note ? ? ?CSN: [id number] ?Arrival date & time: 05/07/21  1404 ? ?  ? ?History ?Chief Complaint  ?Patient presents with  ? Migraine  ? ? ?Shelby Moreno is a 51 y.o. female with history of migraines and vertigo who presents the emergency department with a headache and dizziness that started on Saturday.  Patient typically takes meclizine which usually takes care of her symptoms but she took a meclizine that was expired and offered no relief for her.  Her symptoms have been waxing and waning since Saturday.  No focal weakness or numbness.  She had one episode of vomiting yesterday.  No fever, chills, cough, congestion. ? ? ?Migraine ? ? ?  ? ?Home Medications ?Prior to Admission medications   ?Medication Sig Start Date End Date Taking? Authorizing Provider  ?meclizine (ANTIVERT) 25 MG tablet Take 1 tablet (25 mg total) by mouth 3 (three) times daily as needed for dizziness. 05/07/21  Yes Raul Del, Koralynn Greenspan M, PA-C  ?ondansetron (ZOFRAN-ODT) 4 MG disintegrating tablet Take 1 tablet (4 mg total) by mouth every 8 (eight) hours as needed for nausea or vomiting. 05/07/21  Yes Myna Bright M, PA-C  ?cyclobenzaprine (FLEXERIL) 10 MG tablet Take 1 tablet (10 mg total) by mouth 2 (two) times daily as needed. 12/20/18   Rosemarie Ax, MD  ?Diclofenac Sodium (PENNSAID) 2 % SOLN Place 1 application onto the skin 2 (two) times daily. 09/09/18   Rosemarie Ax, MD  ?doxycycline (VIBRAMYCIN) 100 MG capsule Take 1 capsule (100 mg total) by mouth 2 (two) times daily. One po bid x 7 days 07/10/17   Palumbo, April, MD  ?gabapentin (NEURONTIN) 100 MG capsule SMARTSIG:1 Capsule(s) By Mouth 5 Times Daily PRN 04/18/19   [provider]  ?glycopyrrolate (ROBINUL) 1 MG tablet Take 1 tablet (1 mg total) by mouth 2 (two) times daily. 12/30/16   Ladene Artist, MD  ?HYDROcodone-acetaminophen (NORCO/VICODIN) 5-325 MG tablet Take 1 tablet by mouth 2 (two) times daily as needed for  moderate pain. 12/06/18   Rosemarie Ax, MD  ?hydrOXYzine (ATARAX/VISTARIL) 25 MG tablet Take 25 mg by mouth 2 (two) times daily as needed. 04/19/19   [provider]  ?Ibuprofen-Famotidine 800-26.6 MG TABS Take 1 tablet by mouth 3 (three) times daily. 06/06/19   Rosemarie Ax, MD  ?lidocaine (LIDODERM) 5 % Place 1 patch onto the skin every 12 (twelve) hours. Remove & Discard patch within 12 hours or as directed by MD 06/06/19   Rosemarie Ax, MD  ?ofloxacin (FLOXIN) 0.3 % OTIC solution Place 5 drops into the left ear 2 (two) times daily. 05/05/19   Walisiewicz, Verline Lema E, PA-C  ?ondansetron (ZOFRAN) 8 MG tablet Take 8 mg by mouth every 8 (eight) hours as needed. 04/19/19   [provider]  ?Oxycodone HCl 10 MG TABS Take 10 mg by mouth 3 (three) times daily as needed. 04/20/19   [provider]  ?predniSONE (DELTASONE) 5 MG tablet Take 6 pills for first day, 5 pills second day, 4 pills third day, 3 pills fourth day, 2 pills the fifth day, and 1 pill sixth day. 05/02/21   Rosemarie Ax, MD  ?tizanidine (ZANAFLEX) 2 MG capsule TAKE 1 TO 2 CAPSULES BY MOUTH TWICE DAILY AS NEEDED 03/27/20   [provider]  ?tiZANidine (ZANAFLEX) 4 MG tablet Take 1 tablet (4 mg total) by mouth every 8 (eight) hours as needed for muscle spasms. 01/27/17   Karlton Lemon  R, MD  ?traZODone (DESYREL) 50 MG tablet Take 50 mg by mouth at bedtime. 03/14/19   [provider]  ?valACYclovir (VALTREX) 500 MG tablet  03/08/19   [provider]  ?   ? ?Allergies    ?Augmentin [amoxicillin-pot clavulanate], Keflex [cephalexin], Naproxen, Tramadol, Zithromax [azithromycin], and Morphine and related   ? ?Review of Systems   ?Review of Systems  ?All other systems reviewed and are negative. ? ?Physical Exam ?Updated Vital Signs ?BP (!) 154/89   Pulse 82   Temp 98.4 ?F (36.9 ?C) (Oral)   Resp 18   Ht '5\' 6"'$  (1.676 m)   Wt 73.9 kg   LMP 05/21/2016   SpO2 98%   BMI 26.31 kg/m?  ?Physical  Exam ?Vitals and nursing note reviewed.  ?Constitutional:   ?   General: She is not in acute distress. ?   Appearance: Normal appearance.  ?HENT:  ?   Head: Normocephalic and atraumatic.  ?Eyes:  ?   General:     ?   Right eye: No discharge.     ?   Left eye: No discharge.  ?Cardiovascular:  ?   Comments: Regular rate and rhythm.  S1/S2 are distinct without any evidence of murmur, rubs, or gallops.  Radial pulses are 2+ bilaterally.  Dorsalis pedis pulses are 2+ bilaterally.  No evidence of pedal edema. ?Pulmonary:  ?   Comments: Clear to auscultation bilaterally.  Normal effort.  No respiratory distress.  No evidence of wheezes, rales, or rhonchi heard throughout. ?Abdominal:  ?   General: Abdomen is flat. Bowel sounds are normal. There is no distension.  ?   Tenderness: There is no abdominal tenderness. There is no guarding or rebound.  ?Musculoskeletal:     ?   General: Normal range of motion.  ?   Cervical back: Neck supple.  ?Skin: ?   General: Skin is warm and dry.  ?   Findings: No rash.  ?Neurological:  ?   General: No focal deficit present.  ?   Mental Status: She is alert.  ?   Comments: Cranial nerves II through XII are intact.  5/5 strength to the upper and lower extremities.  Normal sensation to the upper and lower extremities.  No pronator drift.  Normal finger-to-nose without any evidence of dysmetria.  Normal heel-to-shin.  Speaking in complete sentences.  No obvious facial droop.  ?Psychiatric:     ?   Mood and Affect: Mood normal.     ?   Behavior: Behavior normal.  ? ? ?ED Results / Procedures / Treatments   ?Labs ?(all labs ordered are listed, but only abnormal results are displayed) ?Labs Reviewed  ?CBC WITH DIFFERENTIAL/PLATELET - Abnormal; Notable for the following components:  ?    Result Value  ? WBC 10.6 (*)   ? All other components within normal limits  ?BASIC METABOLIC PANEL  ?CBG MONITORING, ED  ? ? ?EKG ?None ? ?Radiology ?No results found. ? ?Procedures ?Procedures  ? ? ?Medications  Ordered in ED ?Medications  ?meclizine (ANTIVERT) tablet 25 mg (25 mg Oral Given 05/07/21 1656)  ?prochlorperazine (COMPAZINE) injection 10 mg (10 mg Intramuscular Given 05/07/21 1657)  ? ? ?ED Course/ Medical Decision Making/ A&P ?Clinical Course as of 05/07/21 1755  ?Tue May 07, 2021  ?1746 On reevaluation, patient states that she is feeling much better from when the time she came in the door but not much better since she got medications in the emergency department.  I  offered the patient a liter of fluid which she declined.  We also discussed again getting imaging of the head and brain which the patient also declined once more.  I encouraged her multiple times at the bedside to come back if her symptoms get worse as patient is requesting to go home and rest. [CF]  ?  ?Clinical Course User Index ?[CF] Myna Bright M, PA-C  ? ?                        ?Medical Decision Making ?Amount and/or Complexity of Data Reviewed ?Labs: ordered. ? ?Risk ?Prescription drug management. ? ? ?This patient presents to the ED for concern of headache and dizziness, this involves an extensive number of treatment options, and is a complaint that carries with it a high risk of complications and morbidity.  The differential diagnosis includes vertigo, migraine, possible ischemic infarct in the brain although this is unlikely given her normal neurological exam, dehydration, electrolyte abnormalities. ? ? ?Co morbidities that complicate the patient evaluation ? ?Past Medical History:  ?Diagnosis Date  ? Anxiety   ? Back pain   ? Depression   ? Migraine   ? Vertigo   ? ? ?Additional history obtained: ? ?Additional history obtained from nursing note ?External records from outside source obtained and reviewed including CT imaging of the brain from 2018 which was normal ? ? ?Lab Tests: ? ?I Ordered, and personally interpreted labs.  The pertinent results include: CBC which shows minimal leukocytosis otherwise normal.  BMP normal.  Glucose  negative. ? ? ?Imaging Studies ordered: ? ?None.  Patient denies CT imaging of the head. ? ? ?Cardiac Monitoring: ? ?The patient was maintained on a cardiac monitor.  I personally viewed and interpreted the cardiac monitor

## 2021-05-07 NOTE — ED Triage Notes (Signed)
Pt c/o "migraine and vertigo" started 3/25-hx of both-states she is out of migraine meds-NAD-steady gait ?

## 2021-06-06 ENCOUNTER — Ambulatory Visit (INDEPENDENT_AMBULATORY_CARE_PROVIDER_SITE_OTHER): Payer: 59 | Admitting: Family Medicine

## 2021-06-06 ENCOUNTER — Encounter: Payer: Self-pay | Admitting: Family Medicine

## 2021-06-06 VITALS — BP 142/90 | Ht 66.0 in | Wt 163.0 lb

## 2021-06-06 DIAGNOSIS — M5416 Radiculopathy, lumbar region: Secondary | ICD-10-CM | POA: Diagnosis not present

## 2021-06-06 MED ORDER — MELOXICAM 15 MG PO TABS: ORAL_TABLET | ORAL | 1 refills | Status: DC

## 2021-06-06 NOTE — Progress Notes (Signed)
?  Shelby Moreno - 51 y.o. female MRN [id number]  Date of birth: [birth year] ? ?SUBJECTIVE:  Including CC & ROS.  ?No chief complaint on file. ? ? ?Shelby Moreno is a 51 y.o. female that is presenting with acute ongoing right leg pain.  No improvement with the prednisone.  The pain is worse with prolonged standing.  She has a pain the lateral aspect of the upper thigh down to the ankle. ? ? ?Review of Systems ?See HPI  ? ?HISTORY: Past Medical, Surgical, Social, and Family History Reviewed & Updated per EMR.   ?Pertinent Historical Findings include: ? ?Past Medical History:  ?Diagnosis Date  ? Anxiety   ? Back pain   ? Depression   ? Migraine   ? Vertigo   ? ? ?Past Surgical History:  ?Procedure Laterality Date  ? ABDOMINAL HYSTERECTOMY    ? ABLATION    ? CESAREAN SECTION    ? TUBAL LIGATION    ? ? ? ?PHYSICAL EXAM:  ?VS: BP (!) 142/90 (BP Location: Left Arm, Patient Position: Sitting)   Ht '5\' 6"'$  (1.676 m)   Wt 163 lb (73.9 kg)   LMP 05/21/2016   BMI 26.31 kg/m?  ?Physical Exam ?Gen: NAD, alert, cooperative with exam, well-appearing ?MSK:  ?Neurovascularly intact   ? ? ? ? ?ASSESSMENT & PLAN:  ? ?Lumbar radiculopathy ?Acutely occurring.  Symptoms seem more consistent with radicular pain down the right leg as opposed IT band syndrome. ?-Counseled on home exercise therapy and supportive care. ?-Mobic. ?-Could consider physical therapy. ? ? ? ? ?

## 2021-06-06 NOTE — Assessment & Plan Note (Signed)
Acutely occurring.  Symptoms seem more consistent with radicular pain down the right leg as opposed IT band syndrome. ?-Counseled on home exercise therapy and supportive care. ?-Mobic. ?-Could consider physical therapy. ?

## 2021-06-26 IMAGING — MR MR LUMBAR SPINE W/O CM
5 series · 46 of 48 positions shown · non-contrast
Comparison: Lumbar spine x-rays dated November 04, 2018. MR lumbar
spine dated September 30, 2014.

CLINICAL DATA: Chronic low back pain with bilateral leg pain,
weakness, and numbness since work related injury 2 years ago. No
prior surgery.

EXAM:
MRI LUMBAR SPINE WITHOUT CONTRAST
TECHNIQUE: Multiplanar, multisequence MR imaging of the lumbar spine was
performed. No intravenous contrast was administered.

[Series 4: T2 post-contrast · sagittal · 4.0mm · 0.88mm/px · 6 of 12 slices shown]
[im 1/12]
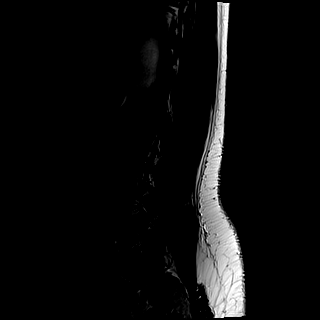
[im 3/12]
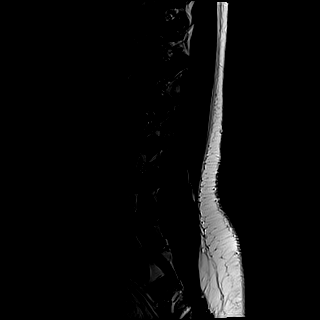
[im 5/12]
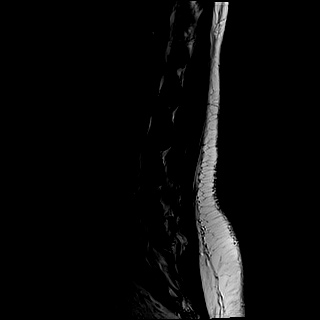
[im 7/12]
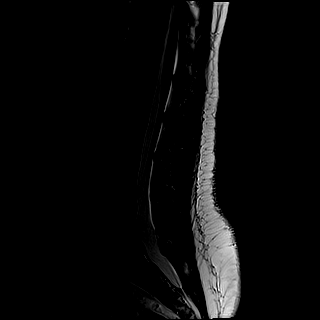
[im 9/12]
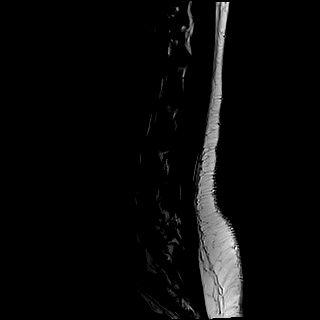
[im 12/12]
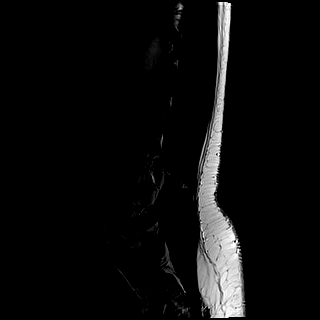

[Series 5: T1 · sagittal · 4.0mm · 0.88mm/px · 6 of 12 slices shown (1 of 2)]
[im 1/12]
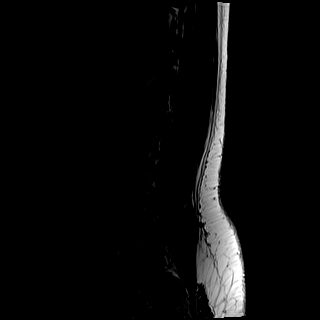
[im 3/12]
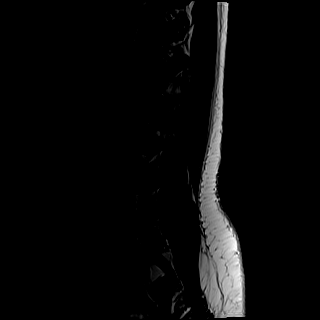
[im 5/12]
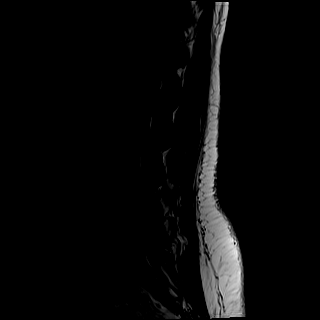
[im 7/12]
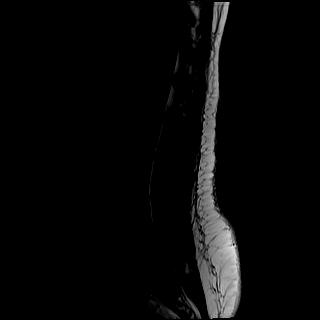
[im 9/12]
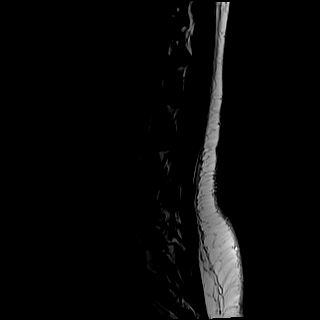
[im 12/12]
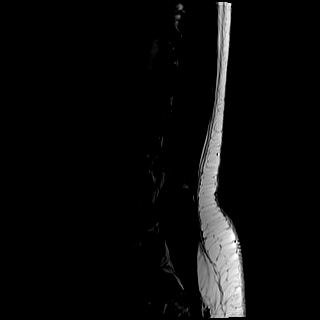

[Series 6: STIR · sagittal · 4.0mm · 0.55mm/px · 6 of 12 slices shown]
[im 1/12]
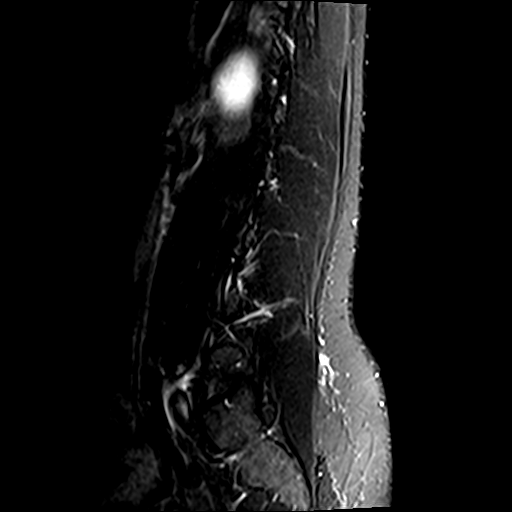
[im 3/12]
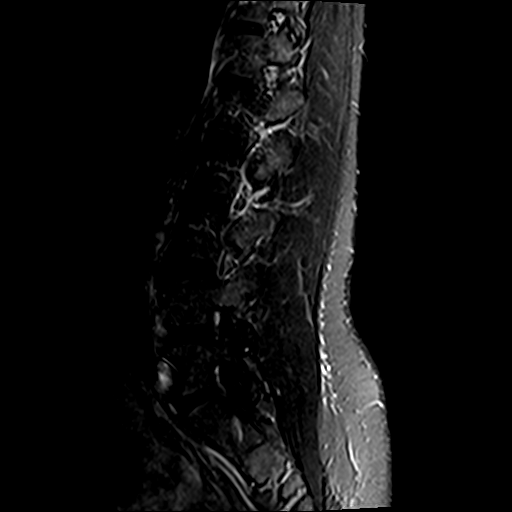
[im 5/12]
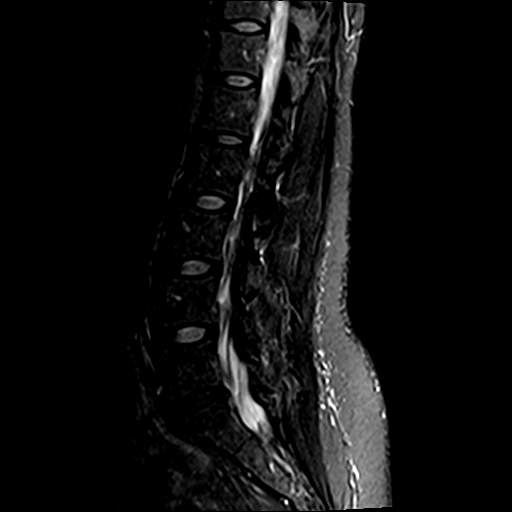
[im 7/12]
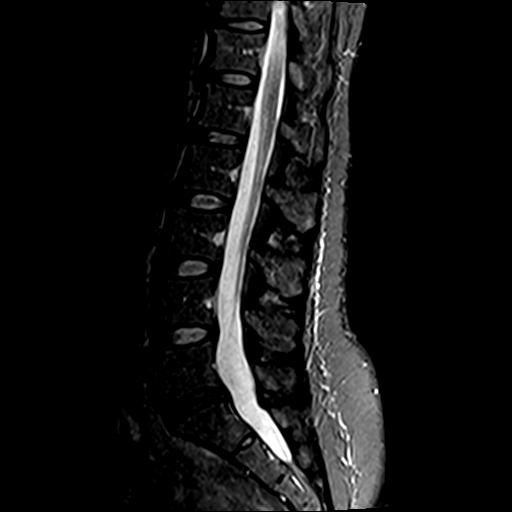
[im 9/12]
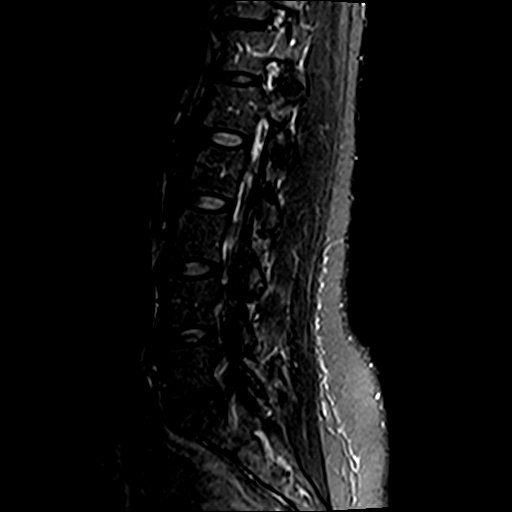
[im 12/12]
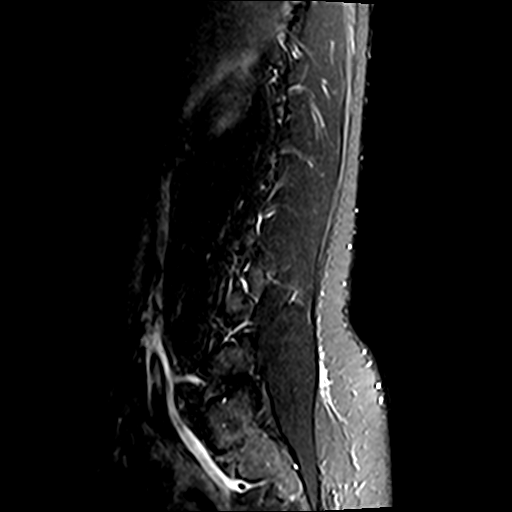

[Series 7: T1 · axial · 4.0mm · 0.74mm/px · z∈[-56,+150]mm · 13 of 32 slices shown (2 of 2)]
[im 1/32]
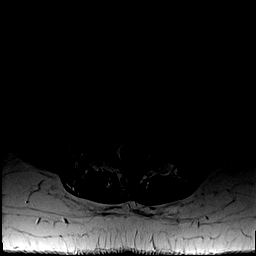
[im 3/32]
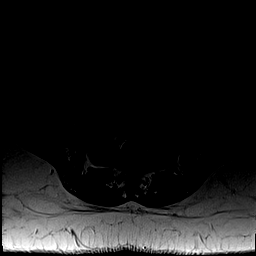
[im 5/32]
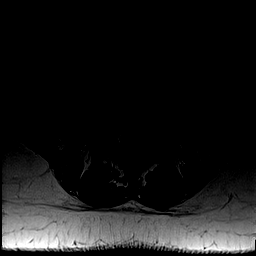
[im 7/32]
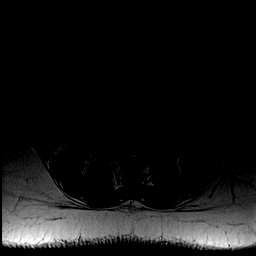
[im 9/32]
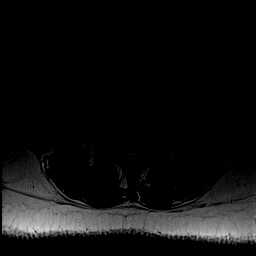
[im 12/32]
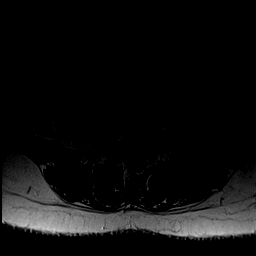
[im 14/32]
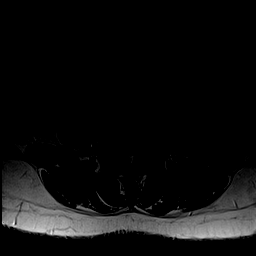
[im 16/32]
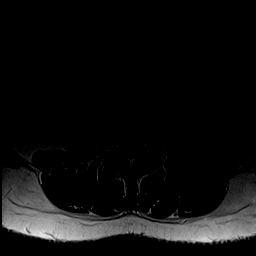
[im 18/32]
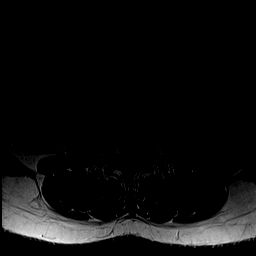
[im 20/32]
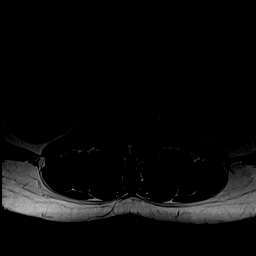
[im 23/32]
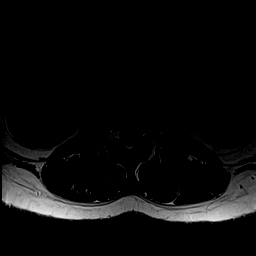
[im 27/32]
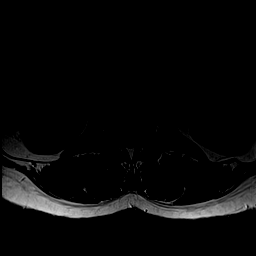
[im 32/32]
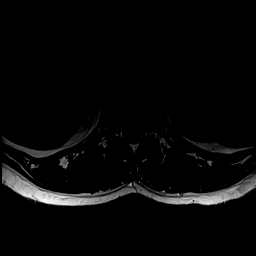

[Series 8: T2 · axial · 4.0mm · 0.74mm/px · z∈[-56,+150]mm · 15 of 32 slices shown]
[im 1/32]
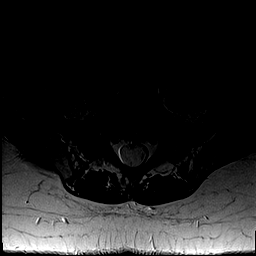
[im 3/32]
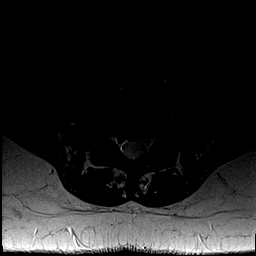
[im 5/32]
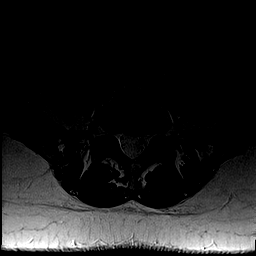
[im 7/32]
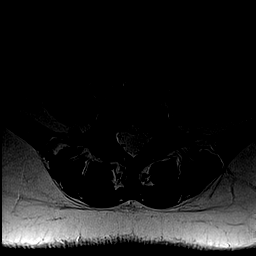
[im 9/32]
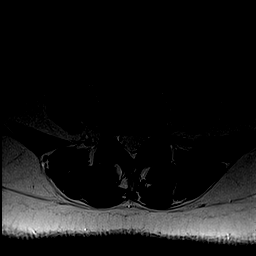
[im 12/32]
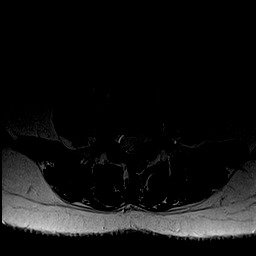
[im 14/32]
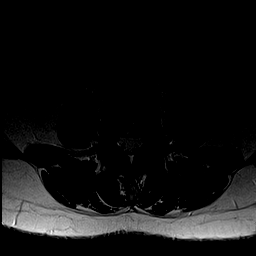
[im 16/32]
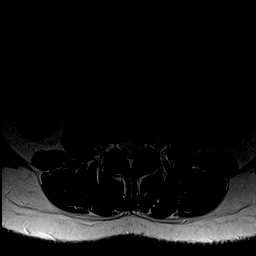
[im 18/32]
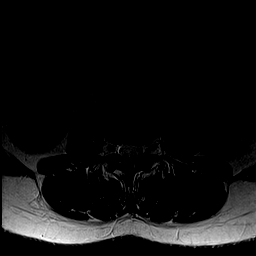
[im 20/32]
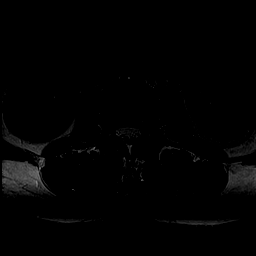
[im 23/32]
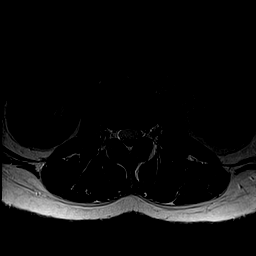
[im 25/32]
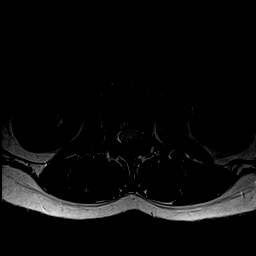
[im 27/32]
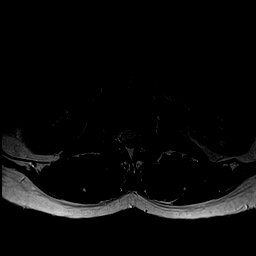
[im 29/32]
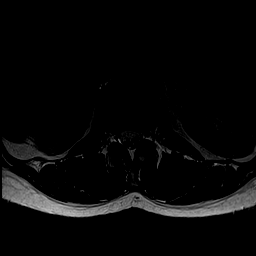
[im 32/32]
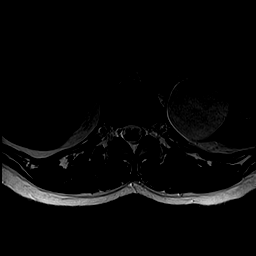

[46 of 48 positions shown; findings below may reference images not displayed]

FINDINGS: Segmentation:  Standard.

Alignment:  Physiologic.

Vertebrae:  No fracture, evidence of discitis, or bone lesion.

Conus medullaris and cauda equina: Conus extends to the L1-L2 level.
Conus and cauda equina appear normal.

Paraspinal and other soft tissues: 4.6 cm left renal simple cyst.
Otherwise negative.

Disc levels:

T12-L1 to L4-L5:  Negative.

L5-S1: Unchanged small central disc protrusion with new annular
fissure. Unchanged mild facet arthropathy. No stenosis.
IMPRESSION: 1. Unchanged small central disc protrusion at L5-S1 with new annular
fissure. No stenosis or impingement.

## 2022-05-10 ENCOUNTER — Emergency Department (HOSPITAL_BASED_OUTPATIENT_CLINIC_OR_DEPARTMENT_OTHER)
Admission: EM | Admit: 2022-05-10 | Discharge: 2022-05-10 | Disposition: A | Discharge status: Home / Self Care | Payer: 59 | Attending: Emergency Medicine | Admitting: Emergency Medicine

## 2022-05-10 ENCOUNTER — Other Ambulatory Visit: Payer: Self-pay

## 2022-05-10 ENCOUNTER — Emergency Department (HOSPITAL_BASED_OUTPATIENT_CLINIC_OR_DEPARTMENT_OTHER): Payer: 59

## 2022-05-10 ENCOUNTER — Encounter (HOSPITAL_BASED_OUTPATIENT_CLINIC_OR_DEPARTMENT_OTHER): Payer: Self-pay | Admitting: Emergency Medicine

## 2022-05-10 DIAGNOSIS — M25511 Pain in right shoulder: Secondary | ICD-10-CM | POA: Diagnosis present

## 2022-05-10 DIAGNOSIS — M5030 Other cervical disc degeneration, unspecified cervical region: Secondary | ICD-10-CM | POA: Diagnosis not present

## 2022-05-10 DIAGNOSIS — M47812 Spondylosis without myelopathy or radiculopathy, cervical region: Secondary | ICD-10-CM | POA: Insufficient documentation

## 2022-05-10 DIAGNOSIS — M5412 Radiculopathy, cervical region: Secondary | ICD-10-CM

## 2022-05-10 MED ORDER — METHYLPREDNISOLONE 4 MG PO TBPK
ORAL_TABLET | ORAL | 0 refills | Status: DC
Start: 2022-05-10 — End: 2022-12-30

## 2022-05-10 MED ORDER — METHOCARBAMOL 500 MG PO TABS
500.0000 mg | ORAL_TABLET | Freq: Once | ORAL | Status: DC
  Filled 2022-05-10: qty 1

## 2022-05-10 MED ORDER — PREDNISONE 50 MG PO TABS
60.0000 mg | ORAL_TABLET | Freq: Once | ORAL | Status: AC
  Administered 2022-05-10: 60 mg via ORAL
  Filled 2022-05-10: qty 1

## 2022-05-10 NOTE — Discharge Instructions (Signed)
Take the steroids as prescribed as well as your Flexeril that you have at home.  Do not take the Flexeril if you are driving as it may make you sleepy.  Follow-up with your primary doctor.  Return to the ED sooner with worsening pain, weakness, numbness, tingling or any other concerns.

## 2022-05-10 NOTE — ED Provider Notes (Signed)
[facility] Provider Note   CSN: [id number] Arrival date & time: 05/10/22  0707     History  Chief Complaint  Patient presents with   Shoulder Pain    Shelby Moreno is a 52 y.o. female.  Pain to right shoulder and arm for the past 4 days.  Constant pain progressively worsening.  Denies any fall or injury or lifting.  States pain starts to her right shoulder blade and radiates down her right arm to her elbow with occasional radiation to her hand.  Intermittent tingling to her fingertips but none currently.  No weakness.  No head pain, neck pain, chest pain, shortness of breath, cough or fever.  Did not take any at home for the pain.  Came in this morning because the pain is quite severe.  Has never had this problem in the past.  No recent chiropractor manipulation.  Denies any specific injury.  Denies weakness in her arm.  Denies any head or neck pain.  Denies any chest pain or shortness of breath.  The history is provided by the patient.  Shoulder Pain Associated symptoms: neck pain   Associated symptoms: no fever        Home Medications Prior to Admission medications   Medication Sig Start Date End Date Taking? Authorizing Provider  cyclobenzaprine (FLEXERIL) 10 MG tablet Take 1 tablet (10 mg total) by mouth 2 (two) times daily as needed. 12/20/18   Rosemarie Ax, MD  gabapentin (NEURONTIN) 100 MG capsule SMARTSIG:1 Capsule(s) By Mouth 5 Times Daily PRN 04/18/19   [provider]  glycopyrrolate (ROBINUL) 1 MG tablet Take 1 tablet (1 mg total) by mouth 2 (two) times daily. 12/30/16   Ladene Artist, MD  HYDROcodone-acetaminophen (NORCO/VICODIN) 5-325 MG tablet Take 1 tablet by mouth 2 (two) times daily as needed for moderate pain. 12/06/18   Rosemarie Ax, MD  hydrOXYzine (ATARAX/VISTARIL) 25 MG tablet Take 25 mg by mouth 2 (two) times daily as needed. 04/19/19   [provider]  lidocaine (LIDODERM) 5 % Place 1  patch onto the skin every 12 (twelve) hours. Remove & Discard patch within 12 hours or as directed by MD 06/06/19   Rosemarie Ax, MD  meclizine (ANTIVERT) 25 MG tablet Take 1 tablet (25 mg total) by mouth 3 (three) times daily as needed for dizziness. 05/07/21   Hendricks Limes, PA-C  meloxicam (MOBIC) 15 MG tablet One tab PO qAM with breakfast for 2 weeks, then daily prn pain. 06/06/21   Rosemarie Ax, MD  ofloxacin (FLOXIN) 0.3 % OTIC solution Place 5 drops into the left ear 2 (two) times daily. 05/05/19   Sherol Dade E, PA-C  ondansetron (ZOFRAN-ODT) 4 MG disintegrating tablet Take 1 tablet (4 mg total) by mouth every 8 (eight) hours as needed for nausea or vomiting. 05/07/21   Myna Bright M, PA-C  predniSONE (DELTASONE) 5 MG tablet Take 6 pills for first day, 5 pills second day, 4 pills third day, 3 pills fourth day, 2 pills the fifth day, and 1 pill sixth day. 05/02/21   Rosemarie Ax, MD  traZODone (DESYREL) 50 MG tablet Take 50 mg by mouth at bedtime. 03/14/19   [provider]  valACYclovir (VALTREX) 500 MG tablet  03/08/19   [provider]      Allergies    Augmentin [amoxicillin-pot clavulanate], Keflex [cephalexin], Naproxen, Tramadol, Zithromax [azithromycin], and Morphine and related    Review of Systems  Review of Systems  Constitutional:  Negative for activity change, appetite change and fever.  HENT:  Negative for congestion and rhinorrhea.   Respiratory:  Negative for cough, chest tightness and shortness of breath.   Cardiovascular:  Negative for chest pain.  Gastrointestinal:  Negative for abdominal pain, nausea and vomiting.  Musculoskeletal:  Positive for arthralgias, myalgias and neck pain.  Skin:  Negative for rash.  Neurological:  Negative for dizziness, weakness and headaches.   all other systems are negative except as noted in the HPI and PMH.    Physical Exam Updated Vital Signs BP (!) 159/89 (BP Location: Left Arm)   Pulse  75   Temp 97.8 F (36.6 C) (Oral)   Resp 18   Ht 5\' 6"  (1.676 m)   Wt 73 kg   LMP 05/21/2016   SpO2 100%   BMI 25.99 kg/m  Physical Exam Vitals and nursing note reviewed.  Constitutional:      General: She is not in acute distress.    Appearance: She is well-developed.  HENT:     Head: Normocephalic and atraumatic.     Mouth/Throat:     Pharynx: No oropharyngeal exudate.  Eyes:     Conjunctiva/sclera: Conjunctivae normal.     Pupils: Pupils are equal, round, and reactive to light.  Neck:     Comments: No meningismus. Cardiovascular:     Rate and Rhythm: Normal rate and regular rhythm.     Heart sounds: Normal heart sounds. No murmur heard. Pulmonary:     Effort: Pulmonary effort is normal. No respiratory distress.     Breath sounds: Normal breath sounds.  Chest:     Chest wall: No tenderness.  Abdominal:     Palpations: Abdomen is soft.     Tenderness: There is no abdominal tenderness. There is no guarding or rebound.  Musculoskeletal:        General: Tenderness present. Normal range of motion.     Cervical back: Normal range of motion and neck supple.     Comments: Right paraspinal cervical and trapezius tenderness.  Pain with range of motion of right arm.  Intact radial pulse and cardinal hand movements.  5/5 grip strength bilaterally.  Skin:    General: Skin is warm.  Neurological:     Mental Status: She is alert and oriented to person, place, and time.     Cranial Nerves: No cranial nerve deficit.     Motor: No abnormal muscle tone.     Coordination: Coordination normal.     Comments: No ataxia on finger to nose bilaterally. No pronator drift. 5/5 strength throughout. CN 2-12 intact.Equal grip strength. Sensation intact.   Psychiatric:        Behavior: Behavior normal.     ED Results / Procedures / Treatments   Labs (all labs ordered are listed, but only abnormal results are displayed) Labs Reviewed - No data to display  EKG None  Radiology DG  Cervical Spine Complete  Result Date: 05/10/2022 CLINICAL DATA:  52 year old female with history of right upper extremity radiculopathy. EXAM: CERVICAL SPINE - COMPLETE 4+ VIEW COMPARISON:  No priors. FINDINGS: Five views of the cervical spine demonstrate no acute displaced fractures or malalignment. Prevertebral soft tissues are normal. Multilevel degenerative disc disease, most pronounced at C4-C5, C5-C6 and C6-C7. Severe multilevel facet arthropathy bilaterally. IMPRESSION: 1. No acute radiographic abnormality of the cervical spine. 2. Multilevel degenerative disc disease and cervical spondylosis, as above. Electronically Signed   By: Vinnie Langton  M.D.   On: 05/10/2022 07:57   DG Shoulder Right  Result Date: 05/10/2022 CLINICAL DATA:  52 year old female with history of right upper extremity radiculopathy. EXAM: RIGHT SHOULDER - 2+ VIEW COMPARISON:  No priors. FINDINGS: There is no evidence of fracture or dislocation. There is no evidence of arthropathy or other focal bone abnormality. Soft tissues are unremarkable. IMPRESSION: Negative. Electronically Signed   By: Vinnie Langton M.D.   On: 05/10/2022 07:56    Procedures Procedures    Medications Ordered in ED Medications  predniSONE (DELTASONE) tablet 60 mg (has no administration in time range)  methocarbamol (ROBAXIN) tablet 500 mg (has no administration in time range)    ED Course/ Medical Decision Making/ A&P                             Medical Decision Making Amount and/or Complexity of Data Reviewed Labs: ordered. Decision-making details documented in ED Course. Radiology: ordered and independent interpretation performed. Decision-making details documented in ED Course. ECG/medicine tests: ordered and independent interpretation performed. Decision-making details documented in ED Course.  Risk Prescription drug management.  Right shoulder and arm pain without trauma.  Concern for cervical radiculopathy.  Neurovascularly  intact.  No weakness, numbness or tingling currently.  No indication for emergent MRI.  Will treat symptomatically.  X-rays are negative for acute traumatic pathology.  Results reviewed and interpreted by me.  There is evidence of spondylosis and degenerative disease in the neck without obvious bulging disc.  Suspect radiculopathy.  Patient declines Flexeril with excess prednisone.  Follow-up with PCP as well as sports medicine.  May need MRI.  Return to ED sooner with weakness in the arm, numbness, tingling, intractable pain or other concerns. No indication for emergent MRI today        Final Clinical Impression(s) / ED Diagnoses Final diagnoses:  None    Rx / DC Orders ED Discharge Orders     None         Debbie Bellucci, Annie Main, MD 05/10/22 [phone]

## 2022-05-10 NOTE — ED Notes (Signed)
ED Provider at bedside. 

## 2022-05-10 NOTE — ED Triage Notes (Signed)
Patient c/o right shoulder pain that started Wednesday night while at work. Pt works for senior living facility. Pt denies any other symptoms.

## 2022-05-13 ENCOUNTER — Ambulatory Visit (INDEPENDENT_AMBULATORY_CARE_PROVIDER_SITE_OTHER): Payer: 59 | Admitting: Family Medicine

## 2022-05-13 ENCOUNTER — Other Ambulatory Visit: Payer: Self-pay

## 2022-05-13 ENCOUNTER — Encounter: Payer: Self-pay | Admitting: Family Medicine

## 2022-05-13 VITALS — BP 130/90 | Ht 66.0 in | Wt 161.0 lb

## 2022-05-13 DIAGNOSIS — M19011 Primary osteoarthritis, right shoulder: Secondary | ICD-10-CM | POA: Diagnosis not present

## 2022-05-13 MED ORDER — TRIAMCINOLONE ACETONIDE 40 MG/ML IJ SUSP
40.0000 mg | Freq: Once | INTRAMUSCULAR | Status: AC
  Administered 2022-05-13: 40 mg via INTRA_ARTICULAR

## 2022-05-13 NOTE — Patient Instructions (Signed)
Good to see you Please try heat  Please try the range of motion movements   Please send me a message in MyChart with any questions or updates.  Please see me back in 2-3 weeks.   --Dr. Raeford Razor

## 2022-05-13 NOTE — Progress Notes (Signed)
  Shelby Moreno - 52 y.o. female MRN [id number]  Date of birth: [birth year]  SUBJECTIVE:  Including CC & ROS.  No chief complaint on file.   Shelby Moreno is a 52 y.o. female that is presenting with acute right shoulder and neck pain.  The pain is acutely occurring.  Ongoing for the past few days.  Denies any injury or inciting event.  She is on chronic pain medication that is offering no improvement.  She denies any history of surgery in the area.   Independent review of the cervical spine x-ray from 3/30 shows degenerative changes between C4-5 and C5-6. Independent review of the right shoulder x-ray from 3/30 shows mild glenohumeral degenerative changes.  Review of Systems See HPI   HISTORY: Past Medical, Surgical, Social, and Family History Reviewed & Updated per EMR.   Pertinent Historical Findings include:  Past Medical History:  Diagnosis Date   Anxiety    Back pain    Depression    Migraine    Vertigo     Past Surgical History:  Procedure Laterality Date   ABDOMINAL HYSTERECTOMY     ABLATION     CESAREAN SECTION     TUBAL LIGATION       PHYSICAL EXAM:  VS: BP (!) 130/90 (BP Location: Left Arm, Patient Position: Sitting)   Ht 5\' 6"  (1.676 m)   Wt 161 lb (73 kg)   LMP 05/21/2016   BMI 25.99 kg/m  Physical Exam Gen: NAD, alert, cooperative with exam, well-appearing MSK:  Neurovascularly intact     Aspiration/Injection Procedure Note Shelby Moreno 1970/09/09  Procedure: Injection Indications: Right shoulder pain  Procedure Details Consent: Risks of procedure as well as the alternatives and risks of each were explained to the (patient/caregiver).  Consent for procedure obtained. Time Out: Verified patient identification, verified procedure, site/side was marked, verified correct patient position, special equipment/implants available, medications/allergies/relevent history reviewed, required imaging and test results available.  Performed.  The area was  cleaned with iodine and alcohol swabs.    The right glenohumeral joint was injected using 4 cc of 1% lidocaine and 0.4 cc of 8.4% sodium bicarbonate on a 22-gauge 3-1/2 inch needle.  The syringe was switched to mixture containing 1 cc's of 40 mg Kenalog and 4 cc's of 0.25% bupivacaine was injected.  Ultrasound was used. Images were obtained in short views showing the injection.     A sterile dressing was applied.  Patient did tolerate procedure well.     ASSESSMENT & PLAN:   Primary osteoarthritis of right shoulder Acutely occurring.  No improvement with her home medications.  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  She also has degenerative change of the cervical spine which could be playing more of a radicular component. -Counseled on home exercise therapy and supportive care. -Injection today. -Could consider physical therapy or further imaging.

## 2022-05-13 NOTE — Assessment & Plan Note (Signed)
Acutely occurring.  No improvement with her home medications.  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  She also has degenerative change of the cervical spine which could be playing more of a radicular component. -Counseled on home exercise therapy and supportive care. -Injection today. -Could consider physical therapy or further imaging.

## 2022-05-19 ENCOUNTER — Encounter: Payer: Self-pay | Admitting: Family Medicine

## 2022-05-20 ENCOUNTER — Ambulatory Visit (INDEPENDENT_AMBULATORY_CARE_PROVIDER_SITE_OTHER): Payer: 59 | Admitting: Family Medicine

## 2022-05-20 ENCOUNTER — Encounter: Payer: Self-pay | Admitting: Family Medicine

## 2022-05-20 VITALS — BP 138/90 | Ht 66.0 in | Wt 161.0 lb

## 2022-05-20 DIAGNOSIS — M5412 Radiculopathy, cervical region: Secondary | ICD-10-CM

## 2022-05-20 MED ORDER — IBUPROFEN 600 MG PO TABS: 600.0000 mg | ORAL_TABLET | Freq: Four times a day (QID) | ORAL | 1 refills | Status: AC | PRN

## 2022-05-20 NOTE — Patient Instructions (Signed)
Good to see you Please use heat as needed  You can take gabapentin 600 mg three times a day if needed  Please only take one muscle relaxer  You can try the ibuprofen and do not take mobic  Will obtain the MRI from [facility] imaging Please send me a message in MyChart with any questions or updates.  Will set up a virtual visit once the MRI is resulted  --Dr. Jordan Likes

## 2022-05-20 NOTE — Assessment & Plan Note (Signed)
Acute on chronic in nature.  Having severe right-sided arm and neck pain.  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  Now having similar symptoms.  Have acutely worsened and now having weakness to resistance and diminished reflexes.  X-rays have been unrevealing. -Counseled on home exercise therapy and supportive care -Counseled on gabapentin. -Ibuprofen. -Counseled on muscle relaxer. -MRI of the cervical spine to evaluate for nerve impingement and consideration of epidural use.

## 2022-05-20 NOTE — Progress Notes (Signed)
  Shelby Moreno - 52 y.o. female MRN [id number]  Date of birth: 21-Nov-1970  SUBJECTIVE:  Including CC & ROS.  No chief complaint on file.   Shelby Moreno is a 52 y.o. female that is presenting with acute worsening of her right-sided neck and arm pain.  She was provided a right shoulder injection that offered no relief to her pain.  Now she is having excruciating severe right arm pain.  She has pain that is worse at night.  It is not controlled with her current pain medication.  She is having altered sensation down the right arm.  She is having weakness..    Review of Systems See HPI   HISTORY: Past Medical, Surgical, Social, and Family History Reviewed & Updated per EMR.   Pertinent Historical Findings include:  Past Medical History:  Diagnosis Date   Anxiety    Back pain    Depression    Migraine    Vertigo     Past Surgical History:  Procedure Laterality Date   ABDOMINAL HYSTERECTOMY     ABLATION     CESAREAN SECTION     TUBAL LIGATION       PHYSICAL EXAM:  VS: BP (!) 138/90 (BP Location: Left Arm, Patient Position: Sitting)   Ht 5\' 6"  (1.676 m)   Wt 161 lb (73 kg)   LMP 05/21/2016   BMI 25.99 kg/m  Physical Exam Gen: NAD, alert, cooperative with exam, well-appearing MSK:  Neck/right arm:  Limited neck range of motion. Positive Spurling's test. Weakness in the right arm to resistance. Diminished tricep and bicep reflex when compared to contralateral side. Weakened grip strength on the right Neurovascularly intact       ASSESSMENT & PLAN:   Cervical radiculopathy Acute on chronic in nature.  Having severe right-sided arm and neck pain.  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  Now having similar symptoms.  Have acutely worsened and now having weakness to resistance and diminished reflexes.  X-rays have been unrevealing. -Counseled on home exercise therapy and supportive care -Counseled on  gabapentin. -Ibuprofen. -Counseled on muscle relaxer. -MRI of the cervical spine to evaluate for nerve impingement and consideration of epidural use.

## 2022-05-26 ENCOUNTER — Encounter: Payer: Self-pay | Admitting: *Deleted

## 2022-05-27 ENCOUNTER — Ambulatory Visit: Payer: 59 | Admitting: Family Medicine

## 2022-05-31 ENCOUNTER — Ambulatory Visit
Admission: RE | Admit: 2022-05-31 | Discharge: 2022-05-31 | Disposition: A | Discharge status: Home / Self Care | Payer: 59 | Source: Ambulatory Visit | Attending: Family Medicine | Admitting: Family Medicine

## 2022-05-31 DIAGNOSIS — M5412 Radiculopathy, cervical region: Secondary | ICD-10-CM

## 2022-06-09 ENCOUNTER — Telehealth (INDEPENDENT_AMBULATORY_CARE_PROVIDER_SITE_OTHER): Payer: 59 | Admitting: Family Medicine

## 2022-06-09 VITALS — Ht 66.0 in | Wt 161.0 lb

## 2022-06-09 DIAGNOSIS — M5412 Radiculopathy, cervical region: Secondary | ICD-10-CM

## 2022-06-09 NOTE — Assessment & Plan Note (Signed)
MRI was revealing for areas of impingement on the right side.  Continues to have right-sided arm pain. -Counseled on home exercise therapy and supportive care. -Pursue epidural.

## 2022-06-09 NOTE — Progress Notes (Signed)
Virtual Visit via Video Note  I connected with Shelby Moreno on 06/09/22 at  1:10 PM EDT by a video enabled telemedicine application and verified that I am speaking with the correct person using two identifiers.  Location: Patient: home Provider: Office   I discussed the limitations of evaluation and management by telemedicine and the availability of in person appointments. The patient expressed understanding and agreed to proceed.  History of Present Illness:  Shelby Moreno is a 52 year old female that is following up after the MRI of her cervical spine.  This was demonstrating cord flattening and moderate to severe foraminal narrowing bilaterally which could affect the C5 nerve root.  Has stable mild spinal stenosis and moderate right foraminal narrowing at the C6 nerve root.  Continues to have right-sided arm pain.   Observations/Objective:   Assessment and Plan:  Cervical radiculopathy: MRI was revealing for areas of impingement on the right side.  Continues to have right-sided arm pain. -Counseled on home exercise therapy and supportive care. -Pursue epidural.  Follow Up Instructions:    I discussed the assessment and treatment plan with the patient. The patient was provided an opportunity to ask questions and all were answered. The patient agreed with the plan and demonstrated an understanding of the instructions.   The patient was advised to call back or seek an in-person evaluation if the symptoms worsen or if the condition fails to improve as anticipated.    Clare Gandy, MD

## 2022-08-26 ENCOUNTER — Other Ambulatory Visit: Payer: Self-pay

## 2022-08-26 ENCOUNTER — Emergency Department (HOSPITAL_COMMUNITY): Payer: 59

## 2022-08-26 ENCOUNTER — Emergency Department (HOSPITAL_COMMUNITY)
Admission: EM | Admit: 2022-08-26 | Discharge: 2022-08-26 | Disposition: A | Discharge status: Home / Self Care | Payer: 59 | Attending: Emergency Medicine | Admitting: Emergency Medicine

## 2022-08-26 DIAGNOSIS — G8929 Other chronic pain: Secondary | ICD-10-CM | POA: Insufficient documentation

## 2022-08-26 DIAGNOSIS — M25552 Pain in left hip: Secondary | ICD-10-CM | POA: Insufficient documentation

## 2022-08-26 DIAGNOSIS — W19XXXA Unspecified fall, initial encounter: Secondary | ICD-10-CM | POA: Insufficient documentation

## 2022-08-26 MED ORDER — LIDOCAINE 5 % EX PTCH: 1.0000 | MEDICATED_PATCH | CUTANEOUS | 0 refills | Status: DC

## 2022-08-26 NOTE — ED Provider Notes (Signed)
[place] EMERGENCY DEPARTMENT AT [facility] Provider Note   CSN: [id number] Arrival date & time: 08/26/22  1609     History  Chief Complaint  Patient presents with   Fall    L hip pain    Shelby Moreno is a 52 y.o. female.  52 year old female with prior medical history as detailed below presents for evaluation.  Patient reports that she was at work this afternoon.  She slipped on a wet floor and landed hard on her left hip.  Patient complains of left hip pain.  She reports multiple chronic pain.  Patient requested x-rays of her left hip.  She declines any pain medication at this time.  Patient reports that she typically uses oxycodone 10 mg tablets at home for pain control.  The history is provided by the patient and medical records.       Home Medications Prior to Admission medications   Medication Sig Start Date End Date Taking? Authorizing Provider  cyclobenzaprine (FLEXERIL) 10 MG tablet Take 1 tablet (10 mg total) by mouth 2 (two) times daily as needed. 12/20/18   Myra Rude, MD  gabapentin (NEURONTIN) 100 MG capsule SMARTSIG:1 Capsule(s) By Mouth 5 Times Daily PRN 04/18/19   [provider]  glycopyrrolate (ROBINUL) 1 MG tablet Take 1 tablet (1 mg total) by mouth 2 (two) times daily. 12/30/16   Meryl Dare, MD  HYDROcodone-acetaminophen (NORCO/VICODIN) 5-325 MG tablet Take 1 tablet by mouth 2 (two) times daily as needed for moderate pain. 12/06/18   Myra Rude, MD  hydrOXYzine (ATARAX/VISTARIL) 25 MG tablet Take 25 mg by mouth 2 (two) times daily as needed. 04/19/19   [provider]  ibuprofen (ADVIL) 600 MG tablet Take 1 tablet (600 mg total) by mouth every 6 (six) hours as needed. 05/20/22   Myra Rude, MD  lidocaine (LIDODERM) 5 % Place 1 patch onto the skin every 12 (twelve) hours. Remove & Discard patch within 12 hours or as directed by MD 06/06/19   Myra Rude, MD  meclizine (ANTIVERT) 25 MG tablet Take 1  tablet (25 mg total) by mouth 3 (three) times daily as needed for dizziness. 05/07/21   Honor Loh M, PA-C  methylPREDNISolone (MEDROL DOSEPAK) 4 MG TBPK tablet As directed 05/10/22   Rancour, Jeannett Senior, MD  ofloxacin (FLOXIN) 0.3 % OTIC solution Place 5 drops into the left ear 2 (two) times daily. 05/05/19   Namon Cirri E, PA-C  ondansetron (ZOFRAN-ODT) 4 MG disintegrating tablet Take 1 tablet (4 mg total) by mouth every 8 (eight) hours as needed for nausea or vomiting. 05/07/21   Honor Loh M, PA-C  predniSONE (DELTASONE) 5 MG tablet Take 6 pills for first day, 5 pills second day, 4 pills third day, 3 pills fourth day, 2 pills the fifth day, and 1 pill sixth day. 05/02/21   Myra Rude, MD  traZODone (DESYREL) 50 MG tablet Take 50 mg by mouth at bedtime. 03/14/19   [provider]  valACYclovir (VALTREX) 500 MG tablet  03/08/19   [provider]      Allergies    Augmentin [amoxicillin-pot clavulanate], Keflex [cephalexin], Naproxen, Tramadol, Zithromax [azithromycin], and Morphine and codeine    Review of Systems   Review of Systems  All other systems reviewed and are negative.   Physical Exam Updated Vital Signs BP (!) 152/88 (BP Location: Right Arm)   Pulse 88   Temp 98.1 F (36.7 C) (Oral)   Resp 18  Ht 5\' 6"  (1.676 m)   Wt 76.2 kg   LMP 05/21/2016   SpO2 96% Comment: Simultaneous filing. User may not have seen previous data.  BMI 27.12 kg/m  Physical Exam Vitals and nursing note reviewed.  Constitutional:      General: She is not in acute distress.    Appearance: Normal appearance. She is well-developed.  HENT:     Head: Normocephalic and atraumatic.  Eyes:     Conjunctiva/sclera: Conjunctivae normal.     Pupils: Pupils are equal, round, and reactive to light.  Cardiovascular:     Rate and Rhythm: Normal rate and regular rhythm.     Heart sounds: Normal heart sounds.  Pulmonary:     Effort: Pulmonary effort is normal. No respiratory  distress.     Breath sounds: Normal breath sounds.  Abdominal:     General: There is no distension.     Palpations: Abdomen is soft.     Tenderness: There is no abdominal tenderness.  Musculoskeletal:        General: No deformity. Normal range of motion.     Cervical back: Normal range of motion and neck supple.     Comments: Moderate tenderness with palpation to the lateral aspect of the left hip.  Distal bilateral lower extremities are neurovascular intact.  No noted shortening or external rotation of the left lower extremity.  Skin:    General: Skin is warm and dry.  Neurological:     General: No focal deficit present.     Mental Status: She is alert and oriented to person, place, and time.     ED Results / Procedures / Treatments   Labs (all labs ordered are listed, but only abnormal results are displayed) Labs Reviewed - No data to display  EKG None  Radiology No results found.  Procedures Procedures    Medications Ordered in ED Medications - No data to display  ED Course/ Medical Decision Making/ A&P                             Medical Decision Making Amount and/or Complexity of Data Reviewed Radiology: ordered.  Risk Prescription drug management.    Medical Screen Complete  This patient presented to the ED with complaint of fall.  This complaint involves an extensive number of treatment options. The initial differential diagnosis includes, but is not limited to, trauma from fall  This presentation is: Acute, Self-Limited, Previously Undiagnosed, Uncertain Prognosis, Complicated, Systemic Symptoms, and Threat to Life/Bodily Function  Patient reports fall at work.    Patient complains of left hip pain after fall.  Imaging without evidence of acute abnormality.  Patient feels improved after workup.  She understands need for close outpatient follow-up.   Additional history obtained:  External records from outside sources obtained and reviewed  including prior ED visits and prior Inpatient records.    Lab Tests:  I ordered and personally interpreted labs.   Imaging Studies ordered:  I ordered imaging studies including plain films of left hip, left femur, left knee I independently visualized and interpreted obtained imaging which showed NAD I agree with the radiologist interpretation.  Problem List / ED Course:  Fall, left hip contusion   Reevaluation:  After the interventions noted above, I reevaluated the patient and found that they have: improved    Disposition:  After consideration of the diagnostic results and the patients response to treatment, I feel that the patent  would benefit from close outpatient follow-up.          Final Clinical Impression(s) / ED Diagnoses Final diagnoses:  Fall, initial encounter    Rx / DC Orders ED Discharge Orders     None         Wynetta Fines, MD 08/26/22 [phone]

## 2022-08-26 NOTE — Discharge Instructions (Signed)
Return for any problem.  ?

## 2022-08-26 NOTE — ED Triage Notes (Signed)
Patient brought in by EMS from Work after falling at work onto L hip. Per EMS there was water on the floor patient fell causing pain to L hip. Patient stated she has chronic back and shoulder pain that is not related to fall. Patient is not on any thinners.  160/90 80 97% RA

## 2022-10-12 LAB — COLOGUARD

## 2022-10-12 LAB — EXTERNAL GENERIC LAB PROCEDURE

## 2022-10-27 LAB — COLOGUARD: COLOGUARD: NEGATIVE

## 2022-10-27 LAB — EXTERNAL GENERIC LAB PROCEDURE: COLOGUARD: NEGATIVE

## 2022-12-30 ENCOUNTER — Emergency Department (HOSPITAL_BASED_OUTPATIENT_CLINIC_OR_DEPARTMENT_OTHER)
Admission: EM | Admit: 2022-12-30 | Discharge: 2022-12-30 | Disposition: A | Discharge status: Home / Self Care | Payer: 59 | Attending: Emergency Medicine | Admitting: Emergency Medicine

## 2022-12-30 ENCOUNTER — Encounter (HOSPITAL_BASED_OUTPATIENT_CLINIC_OR_DEPARTMENT_OTHER): Payer: Self-pay | Admitting: Emergency Medicine

## 2022-12-30 ENCOUNTER — Other Ambulatory Visit: Payer: Self-pay

## 2022-12-30 DIAGNOSIS — R209 Unspecified disturbances of skin sensation: Secondary | ICD-10-CM | POA: Insufficient documentation

## 2022-12-30 DIAGNOSIS — M5432 Sciatica, left side: Secondary | ICD-10-CM

## 2022-12-30 DIAGNOSIS — M5442 Lumbago with sciatica, left side: Secondary | ICD-10-CM | POA: Insufficient documentation

## 2022-12-30 DIAGNOSIS — G8929 Other chronic pain: Secondary | ICD-10-CM | POA: Insufficient documentation

## 2022-12-30 MED ORDER — KETOROLAC TROMETHAMINE 30 MG/ML IJ SOLN
30.0000 mg | Freq: Once | INTRAMUSCULAR | Status: AC
  Administered 2022-12-30: 30 mg via INTRAMUSCULAR
  Filled 2022-12-30: qty 1

## 2022-12-30 MED ORDER — METHYLPREDNISOLONE 4 MG PO TBPK: ORAL_TABLET | ORAL | 0 refills | Status: DC

## 2022-12-30 MED ORDER — METHOCARBAMOL 500 MG PO TABS
500.0000 mg | ORAL_TABLET | Freq: Once | ORAL | Status: DC
  Filled 2022-12-30: qty 1

## 2022-12-30 NOTE — ED Triage Notes (Signed)
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  Patient denies numbness to left leg and denies loss of bowel/bladder.

## 2022-12-30 NOTE — ED Notes (Signed)
Pt states she did not want to take the Robaxin here because she is driving home and states she will take some Flexeril that she has at home.

## 2022-12-30 NOTE — Discharge Instructions (Signed)
Take the steroids as prescribed.  Continue your muscle relaxers as well as pain medication.  Follow-up with your doctor and neurosurgeon.  You may require an MRI for further assessment of this pain if it is not improved.  Return to the ED with worsening pain, weakness, numbness, tingling, bowel or bladder incontinence or any concerns.

## 2022-12-30 NOTE — ED Provider Notes (Signed)
[place] EMERGENCY DEPARTMENT AT [facility] Provider Note   CSN: [id number] Arrival date & time: 12/30/22  0018     History  Chief Complaint  Patient presents with   Back Pain    Shelby Moreno is a 52 y.o. female.  Patient reports left-sided low back pain.  She has a history of back surgery in the past with surgery 2 years ago.  She has intermittent chronic back pain for which she is in pain management and takes oxycodone.  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  Pain is to her left lower back and radiates down the left leg.  Some numbness and tingling to her feet.  No weakness.  No bowel or bladder incontinence.  No fever, chills, nausea, vomiting, pain with urination or blood in the urine. No history of IV drug abuse or cancer.  She is taking her oxycodone when she is not working as she is supposed to take it 3 times a day.  Also was given Flexeril by her plain him with which she is taking.  She complains of left sciatica again. Denies any abdominal pain, chest pain, shortness of breath  The history is provided by the patient.  Back Pain Associated symptoms: no abdominal pain, no chest pain, no dysuria, no fever, no headaches and no weakness        Home Medications Prior to Admission medications   Medication Sig Start Date End Date Taking? Authorizing Provider  cyclobenzaprine (FLEXERIL) 10 MG tablet Take 1 tablet (10 mg total) by mouth 2 (two) times daily as needed. 12/20/18   Myra Rude, MD  gabapentin (NEURONTIN) 100 MG capsule SMARTSIG:1 Capsule(s) By Mouth 5 Times Daily PRN 04/18/19   [provider]  glycopyrrolate (ROBINUL) 1 MG tablet Take 1 tablet (1 mg total) by mouth 2 (two) times daily. 12/30/16   Meryl Dare, MD  HYDROcodone-acetaminophen (NORCO/VICODIN) 5-325 MG tablet Take 1 tablet by mouth 2  (two) times daily as needed for moderate pain. 12/06/18   Myra Rude, MD  hydrOXYzine (ATARAX/VISTARIL) 25 MG tablet Take 25 mg by mouth 2 (two) times daily as needed. 04/19/19   [provider]  ibuprofen (ADVIL) 600 MG tablet Take 1 tablet (600 mg total) by mouth every 6 (six) hours as needed. 05/20/22   Myra Rude, MD  lidocaine (LIDODERM) 5 % Place 1 patch onto the skin every 12 (twelve) hours. Remove & Discard patch within 12 hours or as directed by MD 06/06/19   Myra Rude, MD  lidocaine (LIDODERM) 5 % Place 1 patch onto the skin daily. Remove & Discard patch within 12 hours or as directed by MD 08/26/22   Wynetta Fines, MD  meclizine (ANTIVERT) 25 MG tablet Take 1 tablet (25 mg total) by mouth 3 (three) times daily as needed for dizziness. 05/07/21   Honor Loh M, PA-C  methylPREDNISolone (MEDROL DOSEPAK) 4 MG TBPK tablet As directed 05/10/22   G[place], Jeannett Senior, MD  ofloxacin (FLOXIN) 0.3 % OTIC solution Place 5 drops into the left ear 2 (two) times daily. 05/05/19   Namon Cirri E, PA-C  ondansetron (ZOFRAN-ODT) 4 MG disintegrating tablet Take 1 tablet (4 mg total) by mouth every 8 (eight) hours as needed for nausea or vomiting. 05/07/21   Honor Loh M, PA-C  predniSONE (  DELTASONE) 5 MG tablet Take 6 pills for first day, 5 pills second day, 4 pills third day, 3 pills fourth day, 2 pills the fifth day, and 1 pill sixth day. 05/02/21   Myra Rude, MD  traZODone (DESYREL) 50 MG tablet Take 50 mg by mouth at bedtime. 03/14/19   [provider]  valACYclovir (VALTREX) 500 MG tablet  03/08/19   [provider]      Allergies    Augmentin [amoxicillin-pot clavulanate], Keflex [cephalexin], Naproxen, Tramadol, Zithromax [azithromycin], and Morphine and codeine    Review of Systems   Review of Systems  Constitutional:  Negative for activity change, appetite change and fever.  HENT:  Negative for congestion and rhinorrhea.    Respiratory:  Negative for cough, chest tightness and shortness of breath.   Cardiovascular:  Negative for chest pain.  Gastrointestinal:  Negative for abdominal pain, nausea and vomiting.  Genitourinary:  Negative for dysuria and hematuria.  Musculoskeletal:  Positive for back pain. Negative for arthralgias and myalgias.  Skin:  Negative for rash.  Neurological:  Negative for dizziness, weakness and headaches.   all other systems are negative except as noted in the HPI and PMH.    Physical Exam Updated Vital Signs BP (!) 151/93 (BP Location: Left Arm)   Pulse 88   Temp 97.8 F (36.6 C)   Resp 18   Ht 5\' 6"  (1.676 m)   Wt 72.6 kg   LMP 05/21/2016   SpO2 100%   BMI 25.82 kg/m  Physical Exam Vitals and nursing note reviewed.  Constitutional:      General: She is not in acute distress.    Appearance: She is well-developed.  HENT:     Head: Normocephalic and atraumatic.     Mouth/Throat:     Pharynx: No oropharyngeal exudate.  Eyes:     Conjunctiva/sclera: Conjunctivae normal.     Pupils: Pupils are equal, round, and reactive to light.  Neck:     Comments: No meningismus. Cardiovascular:     Rate and Rhythm: Normal rate and regular rhythm.     Heart sounds: Normal heart sounds. No murmur heard. Pulmonary:     Effort: Pulmonary effort is normal. No respiratory distress.     Breath sounds: Normal breath sounds.  Abdominal:     Palpations: Abdomen is soft.     Tenderness: There is no abdominal tenderness. There is no guarding or rebound.  Musculoskeletal:        General: Tenderness present. Normal range of motion.     Cervical back: Normal range of motion and neck supple.     Comments: Left paraspinal lumbar tenderness, no midline tenderness  5/5 strength in bilateral lower extremities. Ankle plantar and dorsiflexion intact. Great toe extension intact bilaterally. +2 DP and PT pulses. +2 patellar reflexes bilaterally. Normal gait.   Skin:    General: Skin is warm.   Neurological:     Mental Status: She is alert and oriented to person, place, and time.     Cranial Nerves: No cranial nerve deficit.     Motor: No abnormal muscle tone.     Coordination: Coordination normal.     Comments:  5/5 strength throughout. CN 2-12 intact.Equal grip strength.   Psychiatric:        Behavior: Behavior normal.     ED Results / Procedures / Treatments   Labs (all labs ordered are listed, but only abnormal results are displayed) Labs Reviewed - No data to display  EKG  None  Radiology No results found.  Procedures Procedures    Medications Ordered in ED Medications - No data to display  ED Course/ Medical Decision Making/ A&P                                 Medical Decision Making Amount and/or Complexity of Data Reviewed Labs: ordered. Decision-making details documented in ED Course. Radiology: ordered and independent interpretation performed. Decision-making details documented in ED Course. ECG/medicine tests: ordered and independent interpretation performed. Decision-making details documented in ED Course.  Risk Prescription drug management.   Left-sided low back pain that radiates down her left leg.  Intact distal strength, sensation, pulses and reflexes.  Low suspicion for cord compression or cauda equina.  Patient already on chronic pain medication.  She was also given Flexeril by her pain clinic.  Will initiate course of steroids.  She is agreeable to Toradol shot in the ED.  Low suspicion for aortic aneurysm, kidney stone, UTI.  Acute on chronic back pain without evidence of cord compression or cauda equina.  Patient takes oxycodone chronically.  She is tolerating p.o. and ambulatory.  Will initiate course of steroids continue her muscle relaxers as well as pain medication.  Follow-up with her pain clinic as well as her neurosurgeon for consideration of MRI.  No indication for emergent MRI tonight.        Final Clinical  Impression(s) / ED Diagnoses Final diagnoses:  None    Rx / DC Orders ED Discharge Orders     None         Koltin Wehmeyer, Jeannett Senior, MD 12/30/22 [phone]

## 2023-02-25 ENCOUNTER — Other Ambulatory Visit: Payer: Self-pay

## 2023-02-25 ENCOUNTER — Encounter (HOSPITAL_BASED_OUTPATIENT_CLINIC_OR_DEPARTMENT_OTHER): Payer: Self-pay

## 2023-02-25 ENCOUNTER — Emergency Department (HOSPITAL_BASED_OUTPATIENT_CLINIC_OR_DEPARTMENT_OTHER): Payer: Self-pay

## 2023-02-25 ENCOUNTER — Emergency Department (HOSPITAL_BASED_OUTPATIENT_CLINIC_OR_DEPARTMENT_OTHER)
Admission: EM | Admit: 2023-02-25 | Discharge: 2023-02-25 | Disposition: A | Discharge status: Home / Self Care | Payer: Self-pay | Attending: Emergency Medicine | Admitting: Emergency Medicine

## 2023-02-25 DIAGNOSIS — G8929 Other chronic pain: Secondary | ICD-10-CM | POA: Insufficient documentation

## 2023-02-25 DIAGNOSIS — M545 Low back pain, unspecified: Secondary | ICD-10-CM | POA: Insufficient documentation

## 2023-02-25 DIAGNOSIS — Z760 Encounter for issue of repeat prescription: Secondary | ICD-10-CM | POA: Insufficient documentation

## 2023-02-25 MED ORDER — KETOROLAC TROMETHAMINE 30 MG/ML IJ SOLN
15.0000 mg | Freq: Once | INTRAMUSCULAR | Status: AC
  Administered 2023-02-25: 15 mg via INTRAMUSCULAR
  Filled 2023-02-25: qty 1

## 2023-02-25 NOTE — ED Triage Notes (Signed)
 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 Pt denies pain anywhere else. Pt endorses tingling down her BUE.

## 2023-02-25 NOTE — ED Notes (Signed)
 Assumed care of pt, found her alert and oriented talking to her visitor.  Pt indicated that she had a prior back injury and she "re injured her back on Sunday."  No other details offered. Pt was ambulatory in the room.

## 2023-02-25 NOTE — ED Provider Notes (Signed)
 [place] [facility] AT [facility] [place]  Provider Note  CSN: [id number] Arrival date & time: 02/25/23 0014  History Chief Complaint  Patient presents with  . Back Pain    Shelby Moreno is a 53 y.o. 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 She exacerbated it 2 days ago. She has Rx for Percocet 10mg  but lost her insurance at work and has run out a couple of days ago. Scheduled to see Pain Management for refill this week. No red flags.    Home Medications Prior to Admission medications   Medication Sig Start Date End Date Taking? Authorizing Provider  cyclobenzaprine  (FLEXERIL ) 10 MG tablet Take 1 tablet (10 mg total) by mouth 2 (two) times daily as needed. 12/20/18   Schmitz, Jeremy E, MD  gabapentin (NEURONTIN) 100 MG capsule SMARTSIG:1 Capsule(s) By Mouth 5 Times Daily PRN 04/18/19   [provider]  glycopyrrolate  (ROBINUL ) 1 MG tablet Take 1 tablet (1 mg total) by mouth 2 (two) times daily. 12/30/16   Asencion Blacksmith, MD  hydrOXYzine (ATARAX/VISTARIL) 25 MG tablet Take 25 mg by mouth 2 (two) times daily as needed. 04/19/19   [provider]  ibuprofen  (ADVIL ) 600 MG tablet Take 1 tablet (600 mg total) by mouth every 6 (six) hours as needed. 05/20/22   Margaree Shark, MD  meclizine  (ANTIVERT ) 25 MG tablet Take 1 tablet (25 mg total) by mouth 3 (three) times daily as needed for dizziness. 05/07/21   Angelyn Kennel M, PA-C  traZODone (DESYREL) 50 MG tablet Take 50 mg by mouth at bedtime. 03/14/19   [provider]  valACYclovir (VALTREX) 500 MG tablet  03/08/19   [provider]     Allergies    Augmentin [amoxicillin-pot clavulanate], Keflex [cephalexin], Naproxen, Tramadol , Zithromax [azithromycin], and Morphine  and codeine   Review of Systems   Review of Systems Please see HPI for pertinent positives and  negatives  Physical Exam BP (!) 134/92 (BP Location: Left Arm)   Pulse 87   Temp 97.7 F (36.5 C)   Resp 18   Ht 5\' 6"  (1.676 m)   Wt 71.5 kg   LMP 05/21/2016   SpO2 98%   BMI 25.44 kg/m   Physical Exam Vitals and nursing note reviewed.  Constitutional:      Appearance: Normal appearance.  HENT:     Head: Normocephalic and atraumatic.     Nose: Nose normal.     Mouth/Throat:     Mouth: Mucous membranes are moist.  Eyes:     Extraocular Movements: Extraocular movements intact.     Conjunctiva/sclera: Conjunctivae normal.  Cardiovascular:     Rate and Rhythm: Normal rate.  Pulmonary:     Effort: Pulmonary effort is normal.     Breath sounds: Normal breath sounds.  Abdominal:     General: Abdomen is flat.     Palpations: Abdomen is soft.     Tenderness: There is no abdominal tenderness.  Musculoskeletal:        General: Tenderness (diffuse lumbar paraspinal muscles) present. No swelling. Normal range of motion.     Cervical back: Neck supple.  Skin:    General: Skin is warm and dry.  Neurological:     General: No focal deficit present.     Mental Status: She is alert.     Motor: No weakness.  Gait: Gait normal.     Deep Tendon Reflexes: Reflexes normal.  Psychiatric:        Mood and Affect: Mood normal.     ED Results / Procedures / Treatments   EKG None  Procedures Procedures  Medications Ordered in the ED Medications  ketorolac  (TORADOL ) 30 MG/ML injection 15 mg (has no administration in time range)    Initial Impression and Plan  Patient here with MSK low back pain, no signs of cauda equina. She has naproxen allergy listed, but takes motrin  and states she has tolerated toradol  in the past. She has muscle relaxers at home. Recommend she continue those, follow up with Pain Management for long term pain control. Ortho follow up if symptoms worsen.   ED Course   Clinical Course as of 02/25/23 2956  Wed Feb 25, 2023  0312 I personally viewed the  images from radiology studies and agree with radiologist interpretation: Xrays neg for acute fracture [CS]    Clinical Course User Index [CS] Charmayne Cooper, MD     MDM Rules/Calculators/A&P Medical Decision Making Problems Addressed: Chronic bilateral low back pain without sciatica: acute illness or injury  Amount and/or Complexity of Data Reviewed Radiology: ordered.  Risk Prescription drug management.     Final Clinical Impression(s) / ED Diagnoses Final diagnoses:  Chronic bilateral low back pain without sciatica    Rx / DC Orders ED Discharge Orders     None        Charmayne Cooper, MD 02/25/23 [phone]

## 2023-09-21 ENCOUNTER — Emergency Department (HOSPITAL_BASED_OUTPATIENT_CLINIC_OR_DEPARTMENT_OTHER)
Admission: EM | Admit: 2023-09-21 | Discharge: 2023-09-21 | Disposition: A | Discharge status: Home / Self Care | Payer: Worker's Compensation | Attending: Emergency Medicine | Admitting: Emergency Medicine

## 2023-09-21 ENCOUNTER — Encounter (HOSPITAL_BASED_OUTPATIENT_CLINIC_OR_DEPARTMENT_OTHER): Payer: Self-pay | Admitting: Emergency Medicine

## 2023-09-21 ENCOUNTER — Other Ambulatory Visit: Payer: Self-pay

## 2023-09-21 ENCOUNTER — Emergency Department (HOSPITAL_BASED_OUTPATIENT_CLINIC_OR_DEPARTMENT_OTHER): Payer: Self-pay

## 2023-09-21 DIAGNOSIS — U071 COVID-19: Secondary | ICD-10-CM | POA: Insufficient documentation

## 2023-09-21 DIAGNOSIS — R0602 Shortness of breath: Secondary | ICD-10-CM | POA: Diagnosis present

## 2023-09-21 LAB — GROUP A STREP BY PCR: Group A Strep by PCR: NOT DETECTED

## 2023-09-21 LAB — RESP PANEL BY RT-PCR (RSV, FLU A&B, COVID)  RVPGX2
Influenza A by PCR: NEGATIVE
Influenza B by PCR: NEGATIVE
Resp Syncytial Virus by PCR: NEGATIVE
SARS Coronavirus 2 by RT PCR: POSITIVE — AB

## 2023-09-21 MED ORDER — IPRATROPIUM-ALBUTEROL 0.5-2.5 (3) MG/3ML IN SOLN
3.0000 mL | Freq: Once | RESPIRATORY_TRACT | Status: AC
  Administered 2023-09-21 (×2): 3 mL via RESPIRATORY_TRACT
  Filled 2023-09-21: qty 3

## 2023-09-21 NOTE — ED Triage Notes (Addendum)
 Pt reports exposure to black mold at work that is causing her to have a sore throat, HA, cough, congestion, and ShoB since Wednesday, denies fever, CP or other s/s

## 2023-09-21 NOTE — ED Provider Notes (Signed)
 [place] [facility] [place] Provider Note   CSN: [id number] Arrival date & time: 09/21/23  9950     Patient presents with: Shortness of Breath   Shelby Moreno is a 53 y.o. female.   The history is provided by the patient.  Shortness of Breath  She states that there has been some work to remove black mold from her place of employment over the last 10 days.  4 days ago, she noted nasal congestion, sore throat, shortness of breath, cough productive of some clear sputum.  She also lost her voice.  She continues to have nasal congestion and sore throat.  Voice has returned tonight.  She has not had any fever but has had some chills.  She denies any sweats.  Several other coworkers have also been ill.    Prior to Admission medications   Medication Sig Start Date End Date Taking? Authorizing Provider  cyclobenzaprine  (FLEXERIL ) 10 MG tablet Take 1 tablet (10 mg total) by mouth 2 (two) times daily as needed. 12/20/18   Schmitz, Jeremy E, MD  gabapentin (NEURONTIN) 100 MG capsule SMARTSIG:1 Capsule(s) By Mouth 5 Times Daily PRN 04/18/19   [provider]  glycopyrrolate  (ROBINUL ) 1 MG tablet Take 1 tablet (1 mg total) by mouth 2 (two) times daily. 12/30/16   Aneita Gwendlyn DASEN, MD  hydrOXYzine (ATARAX/VISTARIL) 25 MG tablet Take 25 mg by mouth 2 (two) times daily as needed. 04/19/19   [provider]  ibuprofen  (ADVIL ) 600 MG tablet Take 1 tablet (600 mg total) by mouth every 6 (six) hours as needed. 05/20/22   Chick Venetia BRAVO, MD  meclizine  (ANTIVERT ) 25 MG tablet Take 1 tablet (25 mg total) by mouth 3 (three) times daily as needed for dizziness. 05/07/21   Theotis Peers M, PA-C  Oxycodone  HCl 10 MG TABS Take 10 mg by mouth 4 (four) times daily as needed.    [provider]  traZODone (DESYREL) 50 MG tablet Take 50 mg by mouth at bedtime. 03/14/19   [provider]  valACYclovir (VALTREX) 500 MG tablet  03/08/19   [provider]    Allergies: Augmentin [amoxicillin-pot clavulanate], Keflex [cephalexin], Naproxen, Tramadol , Zithromax [azithromycin], and Morphine  and codeine    Review of Systems  Respiratory:  Positive for shortness of breath.   All other systems reviewed and are negative.   Updated Vital Signs BP (!) 151/92 (BP Location: Left Arm)   Pulse 85   Temp 98.1 F (36.7 C) (Oral)   Resp 14   Ht 5' 6 (1.676 m)   Wt 75.8 kg   LMP 05/21/2016   SpO2 99%   BMI 26.95 kg/m   Physical Exam Vitals and nursing note reviewed.  Abdominal:     Tenderness: There is no rebound.   53 year old female, resting comfortably and in no acute distress. Vital signs are significant for elevated blood pressure. Oxygen saturation is 99%, which is normal. Head is normocephalic and atraumatic. PERRLA, EOMI.  There is no sinus tenderness.  There is no obvious rhinorrhea and no edema of the turbinates.  Oropharynx is clear. Neck is nontender and supple without adenopathy. Lungs are clear without rales, wheezes, or rhonchi. Chest is nontender. Heart has regular rate and rhythm without murmur. Abdomen is soft, flat, nontender. Skin is warm and dry without rash. Neurologic: Mental status is normal, cranial nerves are intact, moves all extremities equally.  (all labs ordered are listed, but only abnormal results are displayed)  Labs Reviewed  RESP PANEL BY RT-PCR (RSV, FLU A&B, COVID)  [id number] - Abnormal; Notable for the following components:      Result Value   SARS Coronavirus 2 by RT PCR POSITIVE (*)    All other components within normal limits  GROUP A STREP BY PCR    EKG: EKG Interpretation Date/Time:  Monday September 21 2023 01:06:01 EDT Ventricular Rate:  81 PR Interval:  164 QRS Duration:  86 QT Interval:  382 QTC Calculation: 444 R Axis:   65  Text Interpretation: Sinus rhythm Low voltage, precordial leads Abnormal R-wave progression, early transition No old tracing to compare Confirmed by Raford Lenis  ([id number]) on 09/21/2023 1:08:24 AM  Radiology: DG Chest 2 View Result Date: 09/21/2023 CLINICAL DATA:  Sore throat, coughing and shortness of breath. EXAM: CHEST - 2 VIEW COMPARISON:  PA chest and rib series 06/06/2019 FINDINGS: The heart size and mediastinal contours are within normal limits. Both lungs are clear. The visualized skeletal structures are unremarkable apart from thoracic spondylosis. IMPRESSION: No active cardiopulmonary disease.  Unchanged. Electronically Signed   By: Francis Quam M.D.   On: 09/21/2023 02:15     Procedures   Medications Ordered in the ED - No data to display                                  Medical Decision Making Amount and/or Complexity of Data Reviewed Radiology: ordered.  Risk Prescription drug management.   Respiratory symptoms after exposure to black mold.  Differential diagnosis includes, but is not limited to, allergic reaction to black mold, viral URI, pneumonia.  I have reviewed her electrocardiogram, and my interpretation is low voltage and early transition but otherwise normal ECG.  I have ordered chest x-ray, strep PCR, respiratory pathogen panel and I have also ordered a therapeutic trial of albuterol  with ipratropium via nebulizer.  Chest x-ray shows no acute process.  I have independently viewed the images, and agree with the radiologist's interpretation.  I have reviewed her laboratory tests, and my interpretation is negative PCR for strep, positive PCR for COVID-19, negative PCR for influenza and RSV.  COVID-19 is apparently what is causing her symptoms although I cannot rule out concurrent reaction to black mold.  She noted no improvement with albuterol  with ipratropium via nebulizer.  I am discharging her with COVID-19 instructions.  She does not have any risk factors to warrant antiviral treatment.  I am having her off work for the next 3 days.     Final diagnoses:  COVID-19 virus infection    ED Discharge Orders     None           Raford Lenis, MD 09/21/23 0230

## 2024-02-17 ENCOUNTER — Other Ambulatory Visit: Payer: Self-pay | Admitting: Orthopedic Surgery

## 2024-02-17 DIAGNOSIS — M533 Sacrococcygeal disorders, not elsewhere classified: Secondary | ICD-10-CM

## 2024-03-18 ENCOUNTER — Inpatient Hospital Stay: Admission: RE | Admit: 2024-03-18 | Payer: Self-pay | Source: Ambulatory Visit

## 2024-03-18 ENCOUNTER — Ambulatory Visit: Admission: RE | Admit: 2024-03-18 | Source: Ambulatory Visit

## 2024-03-18 DIAGNOSIS — M533 Sacrococcygeal disorders, not elsewhere classified: Secondary | ICD-10-CM

## 2024-03-18 MED ORDER — IOPAMIDOL (ISOVUE-M 200) INJECTION 41%
10.0000 mL | Freq: Once | INTRAMUSCULAR | Status: AC | PRN
  Administered 2024-03-18: 9 mL via INTRATHECAL
